# Patient Record
Sex: Male | Born: 1960 | Race: White | Hispanic: No | Smoking: Current some day smoker
Health system: Southern US, Community
[De-identification: ages and names within clinical notes are randomized; demographics above are authoritative.]

## PROBLEM LIST (undated history)

## (undated) DIAGNOSIS — Z9889 Other specified postprocedural states: Secondary | ICD-10-CM

## (undated) DIAGNOSIS — N200 Calculus of kidney: Secondary | ICD-10-CM

## (undated) DIAGNOSIS — R112 Nausea with vomiting, unspecified: Secondary | ICD-10-CM

## (undated) HISTORY — PX: CHOLECYSTECTOMY: SHX55

## (undated) HISTORY — PX: JOINT REPLACEMENT: SHX530

---

## 2008-06-16 ENCOUNTER — Ambulatory Visit: Payer: Self-pay | Admitting: Specialist

## 2008-07-19 ENCOUNTER — Ambulatory Visit: Payer: Self-pay | Admitting: Specialist

## 2011-03-27 ENCOUNTER — Ambulatory Visit: Payer: Self-pay | Admitting: Family Medicine

## 2011-04-01 ENCOUNTER — Ambulatory Visit: Payer: Self-pay | Admitting: Family Medicine

## 2011-04-23 ENCOUNTER — Ambulatory Visit: Payer: Self-pay | Admitting: Gastroenterology

## 2011-05-23 ENCOUNTER — Ambulatory Visit: Payer: Self-pay | Admitting: Surgery

## 2011-09-22 ENCOUNTER — Ambulatory Visit: Payer: Self-pay | Admitting: Urology

## 2011-10-10 ENCOUNTER — Ambulatory Visit: Payer: Self-pay | Admitting: Surgery

## 2012-06-04 ENCOUNTER — Ambulatory Visit: Payer: Self-pay | Admitting: Gastroenterology

## 2012-06-08 LAB — PATHOLOGY REPORT

## 2014-09-24 NOTE — Op Note (Signed)
PATIENT NAME:  Clancy GourdROBERTS, Kalim L MR#:  [id number] DATE OF BIRTH:  30-Jan-1961  DATE OF PROCEDURE:  05/23/2011  PREOPERATIVE DIAGNOSIS: Chronic acalculous cholecystitis.   POSTOPERATIVE DIAGNOSIS: Chronic acalculous cholecystitis.  PROCEDURE: Laparoscopic cholecystectomy.   SURGEON: Adella HareJ. Wilton Smith, MD   ASSISTANT: Carmell AustriaAnn Collins, PA   ANESTHESIA: General.   INDICATIONS: This 54 year old male has a history of right upper quadrant pain and ultrasound findings of multiple small polyps in the lining of his gallbladder. Surgery was recommended for definitive treatment.   DESCRIPTION OF PROCEDURE: The patient was placed on the operating table in the supine position under general endotracheal anesthesia. The abdomen was prepared with ChloraPrep and draped in a sterile manner.   A short incision was made in the inferior aspect of the umbilicus and carried down to the deep fascia which was grasped with laryngeal hook and elevated. A Veress needle was inserted, aspirated, and irrigated with a saline solution. Next, the peritoneal cavity was inflated with carbon dioxide. The Veress needle was removed. The 10 mm cannula was inserted. The 10 mm 0 degree laparoscope was inserted to view the peritoneal cavity. The liver appeared to have some minimal degree of fatty infiltration. Another incision was made in the epigastrium slightly to the right of the midline to introduce an 11 mm cannula. Two incisions were made in the lateral aspect of the right upper quadrant to introduce two 5-mm cannulas.   The gallbladder was retracted towards the right shoulder. The infundibulum was retracted inferiorly and laterally. The porta hepatis was demonstrated. Fatty tissue overlying the neck of the gallbladder was incised sharply with the scissors. Next, fatty tissue was dissected away exposing the cystic duct which was dissected free from surrounding structures. The cystic artery was dissected free from surrounding structures. The  neck of the gallbladder was further mobilized with incision of the visceral peritoneum. A critical view of safety was demonstrated. An Endoclip was placed across the cystic duct adjacent to the neck of the gallbladder. An incision was made in the cystic duct to introduce a Reddick catheter, however, the Reddick catheter would not thread and cystic duct caliber appeared to be small and, therefore, cholangiogram was not done. The Reddick catheter was removed. The cystic duct was doubly ligated with endoclips and divided. The cystic artery was controlled with double endoclips and divided. The gallbladder was dissected free from the liver with hook and cautery. There was only scant bleeding and hemostasis was subsequently intact. The gallbladder was delivered up through the infraumbilical incision, opened, suctioned, removed, and submitted in formalin for routine pathology. The right upper quadrant was further inspected. Hemostasis was intact. The cannulas were removed. Carbon dioxide was allowed to escape from the peritoneal cavity. Skin incisions were closed with interrupted 5-0 chromic subcuticular sutures, benzoin, and Steri-Strips. Dressings were applied with paper tape. The patient tolerated surgery satisfactorily and was then prepared for transfer to the recovery room.  ____________________________ Shela CommonsJ. Renda RollsWilton Smith, MD jws:drc D: 05/23/2011 10:05:31 ET T: 05/23/2011 11:43:41 ET JOB#: [id number]  cc: Adella HareJ. Wilton Smith, MD, <Dictator> Adella HareWILTON J SMITH MD ELECTRONICALLY SIGNED 06/15/2011 9:04

## 2015-04-23 ENCOUNTER — Emergency Department
Admission: EM | Admit: 2015-04-23 | Discharge: 2015-04-23 | Disposition: A | Discharge status: Home / Self Care | Payer: BLUE CROSS/BLUE SHIELD | Attending: Emergency Medicine | Admitting: Emergency Medicine

## 2015-04-23 ENCOUNTER — Emergency Department: Payer: BLUE CROSS/BLUE SHIELD

## 2015-04-23 ENCOUNTER — Encounter: Payer: Self-pay | Admitting: Medical Oncology

## 2015-04-23 DIAGNOSIS — N200 Calculus of kidney: Secondary | ICD-10-CM

## 2015-04-23 DIAGNOSIS — R109 Unspecified abdominal pain: Secondary | ICD-10-CM | POA: Diagnosis present

## 2015-04-23 DIAGNOSIS — Z880 Allergy status to penicillin: Secondary | ICD-10-CM | POA: Insufficient documentation

## 2015-04-23 HISTORY — DX: Calculus of kidney: N20.0

## 2015-04-23 LAB — BASIC METABOLIC PANEL
Anion gap: 9 (ref 5–15)
BUN: 12 mg/dL (ref 6–20)
CO2: 21 mmol/L — ABNORMAL LOW (ref 22–32)
Calcium: 8.6 mg/dL — ABNORMAL LOW (ref 8.9–10.3)
Chloride: 100 mmol/L — ABNORMAL LOW (ref 101–111)
Creatinine, Ser: 0.96 mg/dL (ref 0.61–1.24)
GFR calc Af Amer: >60 mL/min (ref >60)
Glucose, Bld: 159 mg/dL — ABNORMAL HIGH (ref 65–99)
POTASSIUM: 3.8 mmol/L (ref 3.5–5.1)
SODIUM: 130 mmol/L — AB (ref 135–145)

## 2015-04-23 LAB — URINALYSIS COMPLETE WITH MICROSCOPIC (ARMC ONLY)
BILIRUBIN URINE: NEGATIVE
Bacteria, UA: NONE SEEN
GLUCOSE, UA: NEGATIVE mg/dL
KETONES UR: NEGATIVE mg/dL
LEUKOCYTES UA: NEGATIVE
NITRITE: NEGATIVE
Protein, ur: 30 mg/dL — AB
SPECIFIC GRAVITY, URINE: 1.024 (ref 1.005–1.030)
pH: 6 (ref 5.0–8.0)

## 2015-04-23 LAB — CBC
HEMATOCRIT: 43.9 % (ref 40.0–52.0)
Hemoglobin: 15 g/dL (ref 13.0–18.0)
MCH: 31.7 pg (ref 26.0–34.0)
MCHC: 34.1 g/dL (ref 32.0–36.0)
MCV: 93 fL (ref 80.0–100.0)
PLATELETS: 276 10*3/uL (ref 150–440)
RBC: 4.72 MIL/uL (ref 4.40–5.90)
RDW: 12.7 % (ref 11.5–14.5)
WBC: 11.3 10*3/uL — AB (ref 3.8–10.6)

## 2015-04-23 MED ORDER — KETOROLAC TROMETHAMINE 30 MG/ML IJ SOLN
30.0000 mg | Freq: Once | INTRAMUSCULAR | Status: AC
  Administered 2015-04-23: 30 mg via INTRAVENOUS
  Filled 2015-04-23: qty 1

## 2015-04-23 MED ORDER — ONDANSETRON HCL 4 MG/2ML IJ SOLN
INTRAMUSCULAR | Status: AC
  Administered 2015-04-23: 4 mg via INTRAVENOUS
  Filled 2015-04-23: qty 2

## 2015-04-23 MED ORDER — TAMSULOSIN HCL 0.4 MG PO CAPS: 0.4000 mg | ORAL_CAPSULE | Freq: Every day | ORAL | Status: DC

## 2015-04-23 MED ORDER — SODIUM CHLORIDE 0.9 % IV BOLUS (SEPSIS)
1000.0000 mL | Freq: Once | INTRAVENOUS | Status: AC
  Administered 2015-04-23: 1000 mL via INTRAVENOUS

## 2015-04-23 MED ORDER — FENTANYL CITRATE (PF) 100 MCG/2ML IJ SOLN
50.0000 ug | Freq: Once | INTRAMUSCULAR | Status: AC
  Administered 2015-04-23: 50 ug via INTRAVENOUS

## 2015-04-23 MED ORDER — OXYCODONE-ACETAMINOPHEN 5-325 MG PO TABS: 1.0000 | ORAL_TABLET | Freq: Four times a day (QID) | ORAL | Status: DC | PRN

## 2015-04-23 MED ORDER — FENTANYL CITRATE (PF) 100 MCG/2ML IJ SOLN
INTRAMUSCULAR | Status: AC
  Administered 2015-04-23: 50 ug via INTRAVENOUS
  Filled 2015-04-23: qty 2

## 2015-04-23 MED ORDER — FENTANYL CITRATE (PF) 100 MCG/2ML IJ SOLN
50.0000 ug | Freq: Once | INTRAMUSCULAR | Status: AC
  Administered 2015-04-23: 50 ug via INTRAVENOUS
  Filled 2015-04-23: qty 2

## 2015-04-23 MED ORDER — TAMSULOSIN HCL 0.4 MG PO CAPS
0.4000 mg | ORAL_CAPSULE | Freq: Once | ORAL | Status: AC
  Administered 2015-04-23: 0.4 mg via ORAL
  Filled 2015-04-23: qty 1

## 2015-04-23 MED ORDER — ONDANSETRON HCL 4 MG/2ML IJ SOLN
4.0000 mg | Freq: Once | INTRAMUSCULAR | Status: AC
  Administered 2015-04-23: 4 mg via INTRAVENOUS

## 2015-04-23 NOTE — ED Provider Notes (Addendum)
[facility] Emergency Department Provider Note  ____________________________________________  Time seen: Approximately 5 PM  I have reviewed the triage vital signs and the nursing notes.   HISTORY  Chief Complaint Flank Pain    HPI Corey Livingston is a 54 y.o. male with a history of kidney stones who is presenting today with left flank pain over the past 3 days. He said that he had sudden onset of left flank pain this past Saturday. He says that it feels like his past kidney stones. [facility]. He has never needed intervention to pass his stones. He does not take Flomax on a regular basis. He says that his urine has also been darker. He describes the pain as moderate to severe and intermittent. It radiates from his left back to his abdomen and down into his pelvis. He said the pain suddenly got worse today and he had episodes of nausea and vomiting.   Past Medical History  Diagnosis Date  . Kidney stone     There are no active problems to display for this patient.   Past Surgical History  Procedure Laterality Date  . Cholecystectomy    . Joint replacement      No current outpatient prescriptions on file.  Allergies Penicillins  No family history on file.  Social History Social History  Substance Use Topics  . Smoking status: Never Smoker   . Smokeless tobacco: None  . Alcohol Use: Yes    Review of Systems Constitutional: No fever/chills Eyes: No visual changes. ENT: No sore throat. Cardiovascular: Denies chest pain. Respiratory: Denies shortness of breath. Gastrointestinal: No diarrhea.  No constipation. Genitourinary: Negative for dysuria. Musculoskeletal: Left flank pain.  Skin: Negative for rash. Neurological: Negative for headaches, focal weakness or numbness.  10-point ROS otherwise negative.  ____________________________________________   PHYSICAL EXAM:  VITAL SIGNS: ED Triage Vitals  Enc Vitals  Group     BP 04/23/15 1558 177/93 mmHg     Pulse Rate 04/23/15 1558 75     Resp 04/23/15 1558 18     Temp 04/23/15 1558 97.3 F (36.3 C)     Temp Source 04/23/15 1558 Oral     SpO2 04/23/15 1558 99 %     Weight 04/23/15 1558 211 lb (95.709 kg)     Height 04/23/15 1558 5\' 8"  (1.727 m)     Head Cir --      Peak Flow --      Pain Score 04/23/15 1558 10     Pain Loc --      Pain Edu? --      Excl. in GC? --     Constitutional: Alert and oriented. Well appearing and in no acute distress. Eyes: Conjunctivae are normal. PERRL. EOMI. Head: Atraumatic. Nose: No congestion/rhinnorhea. Mouth/Throat: Mucous membranes are moist.  Oropharynx non-erythematous. Neck: No stridor.   Cardiovascular: Normal rate, regular rhythm. Grossly normal heart sounds.  Good peripheral circulation. Respiratory: Normal respiratory effort.  No retractions. Lungs CTAB. Gastrointestinal: Soft with mild tenderness to the left upper and left lower quadrant. There is no rebound or guarding.. No distention. No abdominal bruits. Left CVA tenderness. Musculoskeletal: No lower extremity tenderness nor edema.  No joint effusions. Neurologic:  Normal speech and language. No gross focal neurologic deficits are appreciated. No gait instability. Skin:  Skin is warm, dry and intact. No rash noted. Psychiatric: Mood and affect are normal. Speech and behavior are normal.  ____________________________________________   LABS (all labs ordered  are listed, but only abnormal results are displayed)  Labs Reviewed  BASIC METABOLIC PANEL - Abnormal; Notable for the following:    Sodium 130 (*)    Chloride 100 (*)    CO2 21 (*)    Glucose, Bld 159 (*)    Calcium 8.6 (*)    All other components within normal limits  CBC - Abnormal; Notable for the following:    WBC 11.3 (*)    All other components within normal limits  URINALYSIS COMPLETEWITH MICROSCOPIC ([facility] ONLY) - Abnormal; Notable for the following:    Color, Urine YELLOW  (*)    APPearance CLEAR (*)    Hgb urine dipstick 3+ (*)    Protein, ur 30 (*)    Squamous Epithelial / LPF 0-5 (*)    All other components within normal limits   ____________________________________________  EKG   ____________________________________________  RADIOLOGY  No evidence of hydronephrosis. Small 4 mm stone in the right kidney and 9 mm stone in the left. ____________________________________________   PROCEDURES  ____________________________________________   INITIAL IMPRESSION / ASSESSMENT AND PLAN / ED COURSE  Pertinent labs & imaging results that were available during my care of the patient were reviewed by me and considered in my medical decision making (see chart for details).  ----------------------------------------- 6:41 PM on 04/23/2015 -----------------------------------------  Discussed the case with Dr. Annabell Howells of urology who says that as long as the patient has his pain controlled and he may follow-up in the office. Evaluate the patient at this time and still having pain. Says that the pain was transiently relieved with the fentanyl but now the pain is returning. We'll give dose of Toradol.  ----------------------------------------- 7:12 PM on 04/23/2015 -----------------------------------------  Patient is now resting comfortably after Toradol. No further nausea and vomiting. Discussed with the patient his lab, urine as well as imaging findings. More than likely this is a ureteral stone causing the symptoms. The symptoms are classic and the patient also has blood in his urine. I'll be giving him Percocet, Flomax as well as a urine strainer to go home with. He knows to follow with prolonged neurologic. He also knows that he must return immediately to the emergency department if his pain worsens, his vomiting and nausea return or he develops a fever. He also knows that he should return for any worsening or concerning symptoms. Patient understands the plan  and is one to comply. ____________________________________________   FINAL CLINICAL IMPRESSION(S) / ED DIAGNOSES  Flank pain. Kidney stones.    Myrna Blazer, MD 04/23/15 1913  Blood pressure is elevated but I suspect this is likely due to the patient's pain. The patient says that he also gets white coat hypertension when he goes to see his primary care doctor.  Myrna Blazer, MD 04/23/15 [id number]

## 2015-04-23 NOTE — Discharge Instructions (Signed)
Flank Pain °Flank pain refers to pain that is located on the side of the body between the upper abdomen and the back. The pain may occur over a short period of time (acute) or may be long-term or reoccurring (chronic). It may be mild or severe. Flank pain can be caused by many things. °CAUSES  °Some of the more common causes of flank pain include: °· Muscle strains.   °· Muscle spasms.   °· A disease of your spine (vertebral disk disease).   °· A lung infection (pneumonia).   °· Fluid around your lungs (pulmonary edema).   °· A kidney infection.   °· Kidney stones.   °· A very painful skin rash caused by the chickenpox virus (shingles).   °· Gallbladder disease.   °HOME CARE INSTRUCTIONS  °Home care will depend on the cause of your pain. In general, °· Rest as directed by your caregiver. °· Drink enough fluids to keep your urine clear or pale yellow. °· Only take over-the-counter or prescription medicines as directed by your caregiver. Some medicines may help relieve the pain. °· Tell your caregiver about any changes in your pain. °· Follow up with your caregiver as directed. °SEEK IMMEDIATE MEDICAL CARE IF:  °· Your pain is not controlled with medicine.   °· You have new or worsening symptoms. °· Your pain increases.   °· You have abdominal pain.   °· You have shortness of breath.   °· You have persistent nausea or vomiting.   °· You have swelling in your abdomen.   °· You feel faint or pass out.   °· You have blood in your urine. °· You have a fever or persistent symptoms for more than 2-3 days. °· You have a fever and your symptoms suddenly get worse. °MAKE SURE YOU:  °· Understand these instructions. °· Will watch your condition. °· Will get help right away if you are not doing well or get worse. °  °This information is not intended to replace advice given to you by your health care provider. Make sure you discuss any questions you have with your health care provider. °  °Document Released: 07/10/2005 Document  Revised: 02/11/2012 Document Reviewed: 01/01/2012 °Elsevier Interactive Patient Education ©2016 [employer] ° °Kidney Stones °Kidney stones (urolithiasis) are deposits that form inside your kidneys. The intense pain is caused by the stone moving through the urinary tract. When the stone moves, the ureter goes into spasm around the stone. The stone is usually passed in the urine.  °CAUSES  °· A disorder that makes certain neck glands produce too much parathyroid hormone (primary hyperparathyroidism). °· A buildup of uric acid crystals, similar to gout in your joints. °· Narrowing (stricture) of the ureter. °· A kidney obstruction present at birth (congenital obstruction). °· Previous surgery on the kidney or ureters. °· Numerous kidney infections. °SYMPTOMS  °· Feeling sick to your stomach (nauseous). °· Throwing up (vomiting). °· Blood in the urine (hematuria). °· Pain that usually spreads (radiates) to the groin. °· Frequency or urgency of urination. °DIAGNOSIS  °· Taking a history and physical exam. °· Blood or urine tests. °· CT scan. °· Occasionally, an examination of the inside of the urinary bladder (cystoscopy) is performed. °TREATMENT  °· Observation. °· Increasing your fluid intake. °· Extracorporeal shock wave lithotripsy--This is a noninvasive procedure that uses shock waves to break up kidney stones. °· Surgery may be needed if you have severe pain or persistent obstruction. There are various surgical procedures. Most of the procedures are performed with the use of small instruments. Only small incisions are   needed to accommodate these instruments, so recovery time is minimized. °The size, location, and chemical composition are all important variables that will determine the proper choice of action for you. Talk to your health care provider to better understand your situation so that you will minimize the risk of injury to yourself and your kidney.  °HOME CARE INSTRUCTIONS  °· Drink enough water and  fluids to keep your urine clear or pale yellow. This will help you to pass the stone or stone fragments. °· Strain all urine through the provided strainer. Keep all particulate matter and stones for your health care provider to see. The stone causing the pain may be as small as a grain of salt. It is very important to use the strainer each and every time you pass your urine. The collection of your stone will allow your health care provider to analyze it and verify that a stone has actually passed. The stone analysis will often identify what you can do to reduce the incidence of recurrences. °· Only take over-the-counter or prescription medicines for pain, discomfort, or fever as directed by your health care provider. °· Keep all follow-up visits as told by your health care provider. This is important. °· Get follow-up X-rays if required. The absence of pain does not always mean that the stone has passed. It may have only stopped moving. If the urine remains completely obstructed, it can cause loss of kidney function or even complete destruction of the kidney. It is your responsibility to make sure X-rays and follow-ups are completed. Ultrasounds of the kidney can show blockages and the status of the kidney. Ultrasounds are not associated with any radiation and can be performed easily in a matter of minutes. °· Make changes to your daily diet as told by your health care provider. You may be told to: °¨ Limit the amount of salt that you eat. °¨ Eat 5 or more servings of fruits and vegetables each day. °¨ Limit the amount of meat, poultry, fish, and eggs that you eat. °· Collect a 24-hour urine sample as told by your health care provider. You may need to collect another urine sample every 6-12 months. °SEEK MEDICAL CARE IF: °· You experience pain that is progressive and unresponsive to any pain medicine you have been prescribed. °SEEK IMMEDIATE MEDICAL CARE IF:  °· Pain cannot be controlled with the prescribed  medicine. °· You have a fever or shaking chills. °· The severity or intensity of pain increases over 18 hours and is not relieved by pain medicine. °· You develop a new onset of abdominal pain. °· You feel faint or pass out. °· You are unable to urinate. °  °This information is not intended to replace advice given to you by your health care provider. Make sure you discuss any questions you have with your health care provider. °  °Document Released: 05/19/2005 Document Revised: 02/07/2015 Document Reviewed: 10/20/2012 °Elsevier Interactive Patient Education ©2016 [employer] Inc[employer] ° °

## 2015-04-23 NOTE — ED Notes (Signed)
Pt reports that he began having left sided flank pain Saturday that has continued to worsen. Pt reports no difficulty urinating however pain so severe pt began having n/v.

## 2015-04-23 NOTE — ED Notes (Signed)
Patient transported to Ultrasound 

## 2015-12-17 ENCOUNTER — Encounter: Payer: Self-pay | Admitting: Podiatry

## 2015-12-17 ENCOUNTER — Ambulatory Visit (INDEPENDENT_AMBULATORY_CARE_PROVIDER_SITE_OTHER): Payer: BLUE CROSS/BLUE SHIELD

## 2015-12-17 ENCOUNTER — Ambulatory Visit (INDEPENDENT_AMBULATORY_CARE_PROVIDER_SITE_OTHER): Payer: BLUE CROSS/BLUE SHIELD | Admitting: Podiatry

## 2015-12-17 DIAGNOSIS — M79672 Pain in left foot: Secondary | ICD-10-CM | POA: Diagnosis not present

## 2015-12-17 DIAGNOSIS — M7672 Peroneal tendinitis, left leg: Secondary | ICD-10-CM

## 2015-12-17 DIAGNOSIS — M722 Plantar fascial fibromatosis: Secondary | ICD-10-CM

## 2015-12-17 MED ORDER — MELOXICAM 15 MG PO TABS: 15.0000 mg | ORAL_TABLET | Freq: Every day | ORAL | Status: DC

## 2015-12-17 MED ORDER — METHYLPREDNISOLONE 4 MG PO TBPK: ORAL_TABLET | ORAL | Status: DC

## 2015-12-17 NOTE — Patient Instructions (Signed)

## 2015-12-17 NOTE — Progress Notes (Signed)
   Subjective:    Patient ID: Corey Livingston, male    DOB: 1961-03-28, 55 y.o.   MRN: [id number]  HPI: He presents today with as a new patient with a chief complaint of a 6-8 week duration of pain to the plantar and the lateral aspect of his left foot. He states that is very painful on palpation and has had a history of plantar fasciitis in the past.    Review of Systems  All other systems reviewed and are negative.      Objective:   Physical Exam: Vital signs are stable with alert and oriented 3 pulses are palpable. Neurologic sensorium is intact percent lasting monofilament. The tendon reflexes are intact. Muscle strength is normal bilateral. Orthopedic evaluation demonstrates tenderness on palpation fifth metatarsal base of the left foot with no tenderness on palpation of the peroneal tendons. He does have pain on palpation medial tubercle of the left heel radiographs taken today 3 views of the left foot demonstrates an os perineum which is present but is nontender. Otherwise he has soft tissue increase in density of the plantar fascia calcaneal insertion site of the left heel. This is consistent with plantar fasciitis.        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  Plan: I injected the left till today with Kenalog and local anesthetic placement of a fracture brace and the night splint. Start him on a Medrol Dosepak to be followed by meloxicam. Discussed appropriate shoe gear stretching exercises and ice therapy. Follow up with him in 1 month.

## 2016-01-14 ENCOUNTER — Ambulatory Visit (INDEPENDENT_AMBULATORY_CARE_PROVIDER_SITE_OTHER): Payer: BLUE CROSS/BLUE SHIELD | Admitting: Podiatry

## 2016-01-14 ENCOUNTER — Ambulatory Visit: Payer: BLUE CROSS/BLUE SHIELD | Admitting: Podiatry

## 2016-01-14 ENCOUNTER — Encounter: Payer: Self-pay | Admitting: Podiatry

## 2016-01-14 DIAGNOSIS — M722 Plantar fascial fibromatosis: Secondary | ICD-10-CM

## 2016-01-14 MED ORDER — MELOXICAM 15 MG PO TABS: 15.0000 mg | ORAL_TABLET | Freq: Every day | ORAL | 3 refills | Status: DC

## 2016-01-14 NOTE — Progress Notes (Signed)
He presents for follow-up of his lateral compensatory syndrome secondary to plantar fasciitis of the left foot. He states that he is greater than 90% improved. He never took his Medrol or his meloxicam. He did not realize that it was at the pharmacy. He states that his only remaining pain is located in the head of the fifth metatarsal as he points to the region.  Objective: Vital signs are stable. Alert and oriented 3 pulses are palpable. No reproducible pain on palpation of the left heel no reproducible pain on lateral aspect of the foot otherwise the only pain reproducible is a fifth metatarsal head.  Assessment: Well-healing plantar fasciitis and lateral compensatory syndrome.  Plan: I encouraged him to start on his meloxicam we will forego the Medrol at this point since he is somewhat improved. I encouraged him to continue all of his same activities for the next month or so until he is 100% improved plus one month. I will follow-up with him as needed.

## 2020-06-20 ENCOUNTER — Other Ambulatory Visit: Payer: BLUE CROSS/BLUE SHIELD

## 2020-06-20 ENCOUNTER — Other Ambulatory Visit: Payer: Self-pay

## 2020-06-20 DIAGNOSIS — Z20822 Contact with and (suspected) exposure to covid-19: Secondary | ICD-10-CM

## 2020-06-22 LAB — SARS-COV-2, NAA 2 DAY TAT

## 2020-06-22 LAB — NOVEL CORONAVIRUS, NAA: SARS-CoV-2, NAA: NOT DETECTED

## 2020-08-14 ENCOUNTER — Other Ambulatory Visit: Payer: Self-pay

## 2020-08-14 ENCOUNTER — Emergency Department: Payer: Commercial Managed Care - PPO

## 2020-08-14 DIAGNOSIS — Z9660 Presence of unspecified orthopedic joint implant: Secondary | ICD-10-CM | POA: Diagnosis not present

## 2020-08-14 DIAGNOSIS — N202 Calculus of kidney with calculus of ureter: Secondary | ICD-10-CM | POA: Diagnosis not present

## 2020-08-14 DIAGNOSIS — R109 Unspecified abdominal pain: Secondary | ICD-10-CM | POA: Diagnosis present

## 2020-08-14 LAB — URINALYSIS, ROUTINE W REFLEX MICROSCOPIC
Bacteria, UA: NONE SEEN
Bilirubin Urine: NEGATIVE
Glucose, UA: NEGATIVE mg/dL
Ketones, ur: 5 mg/dL — AB
Nitrite: NEGATIVE
Protein, ur: 100 mg/dL — AB
Specific Gravity, Urine: 1.029 (ref 1.005–1.030)
pH: 5 (ref 5.0–8.0)

## 2020-08-14 MED ORDER — KETOROLAC TROMETHAMINE 30 MG/ML IJ SOLN
15.0000 mg | Freq: Once | INTRAMUSCULAR | Status: AC
  Administered 2020-08-14: 15 mg via INTRAMUSCULAR

## 2020-08-14 NOTE — ED Triage Notes (Signed)
Pt states he started having left side flank pain that started this evening. Pt denies difficulty urinating, denies frequency or burning. Pt states last month he passed a clot while peeing.

## 2020-08-15 ENCOUNTER — Emergency Department
Admission: EM | Admit: 2020-08-15 | Discharge: 2020-08-15 | Disposition: A | Discharge status: Home / Self Care | Payer: Commercial Managed Care - PPO | Attending: Emergency Medicine | Admitting: Emergency Medicine

## 2020-08-15 DIAGNOSIS — N200 Calculus of kidney: Secondary | ICD-10-CM

## 2020-08-15 LAB — BASIC METABOLIC PANEL
Anion gap: 8 (ref 5–15)
BUN: 12 mg/dL (ref 6–20)
CO2: 24 mmol/L (ref 22–32)
Calcium: 9.7 mg/dL (ref 8.9–10.3)
Chloride: 105 mmol/L (ref 98–111)
Creatinine, Ser: 0.87 mg/dL (ref 0.61–1.24)
GFR, Estimated: >60 mL/min (ref >60)
Glucose, Bld: 180 mg/dL — ABNORMAL HIGH (ref 70–99)
Potassium: 4.2 mmol/L (ref 3.5–5.1)
Sodium: 137 mmol/L (ref 135–145)

## 2020-08-15 LAB — CBC WITH DIFFERENTIAL/PLATELET
Abs Immature Granulocytes: 0.08 10*3/uL — ABNORMAL HIGH (ref 0.00–0.07)
Basophils Absolute: 0.1 10*3/uL (ref 0.0–0.1)
Basophils Relative: 1 %
Eosinophils Absolute: 0.1 10*3/uL (ref 0.0–0.5)
Eosinophils Relative: 1 %
HCT: 49.5 % (ref 39.0–52.0)
Hemoglobin: 17.1 g/dL — ABNORMAL HIGH (ref 13.0–17.0)
Immature Granulocytes: 1 %
Lymphocytes Relative: 12 %
Lymphs Abs: 1.2 10*3/uL (ref 0.7–4.0)
MCH: 33.5 pg (ref 26.0–34.0)
MCHC: 34.5 g/dL (ref 30.0–36.0)
MCV: 97.1 fL (ref 80.0–100.0)
Monocytes Absolute: 0.4 10*3/uL (ref 0.1–1.0)
Monocytes Relative: 4 %
Neutro Abs: 8.4 10*3/uL — ABNORMAL HIGH (ref 1.7–7.7)
Neutrophils Relative %: 81 %
Platelets: 206 10*3/uL (ref 150–400)
RBC: 5.1 MIL/uL (ref 4.22–5.81)
RDW: 12.7 % (ref 11.5–15.5)
WBC: 10.3 10*3/uL (ref 4.0–10.5)
nRBC: 0 % (ref 0.0–0.2)

## 2020-08-15 MED ORDER — ONDANSETRON 4 MG PO TBDP: 4.0000 mg | ORAL_TABLET | Freq: Four times a day (QID) | ORAL | 0 refills | Status: DC | PRN

## 2020-08-15 MED ORDER — KETOROLAC TROMETHAMINE 30 MG/ML IJ SOLN
15.0000 mg | Freq: Once | INTRAMUSCULAR | Status: AC
  Administered 2020-08-15: 15 mg via INTRAMUSCULAR
  Filled 2020-08-15: qty 1

## 2020-08-15 MED ORDER — OXYCODONE-ACETAMINOPHEN 5-325 MG PO TABS: 2.0000 | ORAL_TABLET | Freq: Four times a day (QID) | ORAL | 0 refills | Status: DC | PRN

## 2020-08-15 MED ORDER — TAMSULOSIN HCL 0.4 MG PO CAPS: 0.4000 mg | ORAL_CAPSULE | Freq: Every day | ORAL | 0 refills | Status: DC

## 2020-08-15 MED ORDER — IBUPROFEN 800 MG PO TABS: 800.0000 mg | ORAL_TABLET | Freq: Three times a day (TID) | ORAL | 0 refills | Status: DC | PRN

## 2020-08-15 MED ORDER — OXYCODONE-ACETAMINOPHEN 5-325 MG PO TABS
2.0000 | ORAL_TABLET | Freq: Once | ORAL | Status: AC
  Administered 2020-08-15: 2 via ORAL
  Filled 2020-08-15: qty 2

## 2020-08-15 MED ORDER — ONDANSETRON 4 MG PO TBDP
4.0000 mg | ORAL_TABLET | Freq: Once | ORAL | Status: AC
  Administered 2020-08-15: 4 mg via ORAL
  Filled 2020-08-15: qty 1

## 2020-08-15 NOTE — ED Notes (Signed)
Pt reports right left flank pain that began tonight. Pt reports that he initially thought it may be gas pains but did not a lot of gas released. Pt denies any current urinary burning, urgency, or frequency.

## 2020-08-15 NOTE — Discharge Instructions (Signed)

## 2020-08-15 NOTE — ED Provider Notes (Signed)
[facility] Emergency Department Provider Note  ____________________________________________   Event Date/Time   First MD Initiated Contact with Patient 08/15/20 [phone]     (approximate)  I have reviewed the triage vital signs and the nursing notes.   HISTORY  Chief Complaint Flank Pain    HPI Corey Livingston is a 60 y.o. male history of previous kidney stones who presents to the emergency department with left flank pain that started tonight.  Complains of nausea but no vomiting.  No fever.  No dysuria, hematuria.  States this felt slightly different than his previous kidney stones.  Was given IM Toradol in the waiting room and reports his pain has almost completely resolved.  Was previously seen by [facility] urology but has not seen them in years.        Past Medical History:  Diagnosis Date  . Kidney stone     There are no problems to display for this patient.   Past Surgical History:  Procedure Laterality Date  . CHOLECYSTECTOMY    . JOINT REPLACEMENT      Prior to Admission medications   Medication Sig Start Date End Date Taking? Authorizing Provider  ibuprofen (ADVIL) 800 MG tablet Take 1 tablet (800 mg total) by mouth every 8 (eight) hours as needed for mild pain. 08/15/20  Yes Ashayla Subia N, DO  ondansetron (ZOFRAN ODT) 4 MG disintegrating tablet Take 1 tablet (4 mg total) by mouth every 6 (six) hours as needed for nausea or vomiting. 08/15/20  Yes Jeree Delcid, Layla Maw, DO  oxyCODONE-acetaminophen (PERCOCET) 5-325 MG tablet Take 2 tablets by mouth every 6 (six) hours as needed for severe pain. 08/15/20 08/15/21 Yes Ismelda Weatherman, Layla Maw, DO  tamsulosin (FLOMAX) 0.4 MG CAPS capsule Take 1 capsule (0.4 mg total) by mouth daily. 08/15/20  Yes Khadeem Rockett, Layla Maw, DO  meloxicam (MOBIC) 15 MG tablet Take 1 tablet (15 mg total) by mouth daily. 01/14/16   Hyatt, Max T, DPM    Allergies Penicillins  No family history on file.  Social History Social History    Tobacco Use  . Smoking status: Never Smoker  . Smokeless tobacco: Never Used  Substance Use Topics  . Alcohol use: Yes    Review of Systems Constitutional: No fever. Eyes: No visual changes. ENT: No sore throat. Cardiovascular: Denies chest pain. Respiratory: Denies shortness of breath. Gastrointestinal: No vomiting, diarrhea. Genitourinary: Negative for dysuria. Musculoskeletal: Negative for back pain. Skin: Negative for rash. Neurological: Negative for focal weakness or numbness.  ____________________________________________   PHYSICAL EXAM:  VITAL SIGNS: ED Triage Vitals  Enc Vitals Group     BP 08/14/20 2324 136/79     Pulse Rate 08/14/20 2324 72     Resp 08/14/20 2324 16     Temp 08/14/20 2324 98 F (36.7 C)     Temp src --      SpO2 08/14/20 2324 97 %     Weight 08/14/20 2322 245 lb (111.1 kg)     Height 08/14/20 2322 5\' 7"  (1.702 m)     Head Circumference --      Peak Flow --      Pain Score 08/14/20 2322 8     Pain Loc --      Pain Edu? --      Excl. in GC? --    CONSTITUTIONAL: Alert and oriented and responds appropriately to questions. Well-appearing; well-nourished HEAD: Normocephalic EYES: Conjunctivae clear, pupils appear equal, EOM appear intact ENT: normal nose; moist  mucous membranes NECK: Supple, normal ROM CARD: RRR; S1 and S2 appreciated; no murmurs, no clicks, no rubs, no gallops RESP: Normal chest excursion without splinting or tachypnea; breath sounds clear and equal bilaterally; no wheezes, no rhonchi, no rales, no hypoxia or respiratory distress, speaking full sentences ABD/GI: Normal bowel sounds; non-distended; soft, non-tender, no rebound, no guarding, no peritoneal signs, no hepatosplenomegaly, obese BACK: The back appears normal EXT: Normal ROM in all joints; no deformity noted, no edema; no cyanosis SKIN: Normal color for age and race; warm; no rash on exposed skin NEURO: Moves all extremities equally PSYCH: The patient's mood  and manner are appropriate.  ____________________________________________   LABS (all labs ordered are listed, but only abnormal results are displayed)  Labs Reviewed  URINALYSIS, ROUTINE W REFLEX MICROSCOPIC - Abnormal; Notable for the following components:      Result Value   Color, Urine AMBER (*)    APPearance HAZY (*)    Hgb urine dipstick MODERATE (*)    Ketones, ur 5 (*)    Protein, ur 100 (*)    Leukocytes,Ua TRACE (*)    All other components within normal limits  CBC WITH DIFFERENTIAL/PLATELET - Abnormal; Notable for the following components:   Hemoglobin 17.1 (*)    Neutro Abs 8.4 (*)    Abs Immature Granulocytes 0.08 (*)    All other components within normal limits  BASIC METABOLIC PANEL - Abnormal; Notable for the following components:   Glucose, Bld 180 (*)    All other components within normal limits   ____________________________________________  EKG None ____________________________________________  RADIOLOGY I, Danasia Baker, personally viewed and evaluated these images (plain radiographs) as part of my medical decision making, as well as reviewing the written report by the radiologist.  ED MD interpretation: Left sided proximal ureteral stone.  Official radiology report(s): CT Renal Stone Study  Result Date: 08/14/2020 CLINICAL DATA:  Left flank pain. EXAM: CT ABDOMEN AND PELVIS WITHOUT CONTRAST TECHNIQUE: Multidetector CT imaging of the abdomen and pelvis was performed following the standard protocol without IV contrast. COMPARISON:  Oct 10, 2011 FINDINGS: Lower chest: No acute abnormality. Hepatobiliary: No focal liver abnormality is seen. Status post cholecystectomy. No biliary dilatation. Pancreas: Unremarkable. No pancreatic ductal dilatation or surrounding inflammatory changes. Spleen: Normal in size without focal abnormality. Adrenals/Urinary Tract: Adrenal glands are unremarkable. Kidneys are normal, without focal lesions. A 6 mm obstructing renal  stone is seen within the proximal left ureter, with mild left-sided hydronephrosis and hydroureter. The urinary bladder is contracted and subsequently limited in evaluation. Stomach/Bowel: Stomach is within normal limits. The appendix is not identified. No evidence of bowel wall thickening, distention, or inflammatory changes. Noninflamed diverticula are seen throughout the sigmoid colon. Vascular/Lymphatic: Aortic atherosclerosis. No enlarged abdominal or pelvic lymph nodes. Reproductive: Prostate is unremarkable. Other: No abdominal wall hernia or abnormality. No abdominopelvic ascites. Musculoskeletal: No acute or significant osseous findings. IMPRESSION: 1.   6 mm obstructing renal stone within the proximal left ureter. 2.   Sigmoid diverticulosis. 3.   Evidence of prior cholecystectomy. 4. Aortic atherosclerosis. Aortic Atherosclerosis (ICD10-I70.0). Electronically Signed   By: Aram Candela M.D.   On: 08/14/2020 23:56    ____________________________________________   PROCEDURES  Procedure(s) performed (including Critical Care):  Procedures  ____________________________________________   INITIAL IMPRESSION / ASSESSMENT AND PLAN / ED COURSE  As part of my medical decision making, I reviewed the following data within the electronic MEDICAL RECORD NUMBER History obtained from family, Nursing notes reviewed and incorporated, Labs reviewed ,  Old chart reviewed, Notes from prior ED visits and [place] Controlled Substance Database         Patient here with left-sided flank pain.  Imaging shows a 6 mm proximal ureteral stone.  Urine shows red blood cells and white blood cells but no bacteria.  He is afebrile.  He has not had any blood work since 2016 in our system.  We will check basic labs today to ensure no significant leukocytosis and normal creatinine.  He reports his pain is well controlled at this time and declines any further pain medicine.  Anticipate if he continues to do well that he will be  discharged home with pain medication and outpatient urology follow-up.  ED PROGRESS  Patient's labs are reassuring.  No leukocytosis.  Normal creatinine.  Ports pain is increasing a little bit.  Will give another dose of IM Toradol as well as Percocet, Zofran.  Will discharge home with outpatient follow-up and prescriptions.  Patient comfortable with plan.   At this time, I do not feel there is any life-threatening condition present. I have reviewed, interpreted and discussed all results (EKG, imaging, lab, urine as appropriate) and exam findings with patient/family. I have reviewed nursing notes and appropriate previous records.  I feel the patient is safe to be discharged home without further emergent workup and can continue workup as an outpatient as needed. Discussed usual and customary return precautions. Patient/family verbalize understanding and are comfortable with this plan.  Outpatient follow-up has been provided as needed. All questions have been answered.  ____________________________________________   FINAL CLINICAL IMPRESSION(S) / ED DIAGNOSES  Final diagnoses:  Kidney stone     ED Discharge Orders         Ordered    oxyCODONE-acetaminophen (PERCOCET) 5-325 MG tablet  Every 6 hours PRN        08/15/20 0131    ibuprofen (ADVIL) 800 MG tablet  Every 8 hours PRN        08/15/20 0131    ondansetron (ZOFRAN ODT) 4 MG disintegrating tablet  Every 6 hours PRN        08/15/20 0131    tamsulosin (FLOMAX) 0.4 MG CAPS capsule  Daily        08/15/20 0131          *Please note:  Corey Livingston was evaluated in Emergency Department on 08/15/2020 for the symptoms described in the history of present illness. He was evaluated in the context of the global COVID-19 pandemic, which necessitated consideration that the patient might be at risk for infection with the SARS-CoV-2 virus that causes COVID-19. Institutional protocols and algorithms that pertain to the evaluation of patients at  risk for COVID-19 are in a state of rapid change based on information released by regulatory bodies including the CDC and federal and state organizations. These policies and algorithms were followed during the patient's care in the ED.  Some ED evaluations and interventions may be delayed as a result of limited staffing during and the pandemic.*   Note:  This document was prepared using Dragon voice recognition software and may include unintentional dictation errors.   Passion Lavin, Layla Maw, DO 08/15/20 [id number]

## 2020-08-18 NOTE — H&P (View-Only) (Signed)
08/20/2020 11:13 AM   Raphael Gibney 01/16/1961 [id number]  Referring provider: Maryland Pink, MD [address] [facility] [place],  [place] [address] Chief Complaint  Patient presents with  . Nephrolithiasis    HPI: Corey Livingston is a 60 y.o. male who is seen today for ED follow-up of a recent episode of renal colic  - Patient reported to the ED on 08/15/2020 left flank pain.  - He noted nausea but no vomiting. - No dysuria or hematuria.  - He reported a history of kidney stones.  - He was given Flomax, percocet for pain prn, and zofran.  - CT renal stone study noted a 6 mm obstructing renal stone within the proximal left ureter with mild left hydronephrosis/hydroureter -Since ED visit he has had one episode of recurrent flank pain -Previous history of passing a 10 mm calculus   PMH: Past Medical History:  Diagnosis Date  . Kidney stone     Surgical History: Past Surgical History:  Procedure Laterality Date  . CHOLECYSTECTOMY    . JOINT REPLACEMENT      Home Medications:  Allergies as of 08/20/2020      Reactions   Penicillins       Medication List       Accurate as of August 20, 2020 11:13 AM. If you have any questions, ask your nurse or doctor.        ibuprofen 800 MG tablet Commonly known as: ADVIL Take 1 tablet (800 mg total) by mouth every 8 (eight) hours as needed for mild pain.   meloxicam 15 MG tablet Commonly known as: MOBIC Take 1 tablet (15 mg total) by mouth daily.   ondansetron 4 MG disintegrating tablet Commonly known as: Zofran ODT Take 1 tablet (4 mg total) by mouth every 6 (six) hours as needed for nausea or vomiting.   oxyCODONE-acetaminophen 5-325 MG tablet Commonly known as: Percocet Take 2 tablets by mouth every 6 (six) hours as needed for severe pain.   tamsulosin 0.4 MG Caps capsule Commonly known as: Flomax Take 1 capsule (0.4 mg total) by mouth daily.       Allergies:  Allergies  Allergen Reactions  .  Penicillins     Family History: History reviewed. No pertinent family history.  Social History:  reports that he has never smoked. He has never used smokeless tobacco. He reports current alcohol use. No history on file for drug use.   Physical Exam: BP (!) 178/95   Pulse 99   Ht 5' 10" (1.778 m)   Wt 245 lb (111.1 kg)   BMI 35.15 kg/m   Constitutional:  Alert and oriented, No acute distress. HEENT: [place] AT, moist mucus membranes.  Trachea midline, no masses. Cardiovascular: No clubbing, cyanosis, or edema. Respiratory: Normal respiratory effort, no increased work of breathing. Skin: No rashes, bruises or suspicious lesions. Neurologic: Grossly intact, no focal deficits, moving all 4 extremities. Psychiatric: Normal mood and affect.  Laboratory Data:  Lab Results  Component Value Date   CREATININE 0.87 08/15/2020    Urinalysis: Pending  Pertinent Imaging: CT images were personally reviewed and interpreted  CT Renal Stone Study  Narrative CLINICAL DATA:  Left flank pain.  EXAM: CT ABDOMEN AND PELVIS WITHOUT CONTRAST  TECHNIQUE: Multidetector CT imaging of the abdomen and pelvis was performed following the standard protocol without IV contrast.  COMPARISON:  Oct 10, 2011  FINDINGS: Lower chest: No acute abnormality.  Hepatobiliary: No focal liver abnormality is seen. Status  post cholecystectomy. No biliary dilatation.  Pancreas: Unremarkable. No pancreatic ductal dilatation or surrounding inflammatory changes.  Spleen: Normal in size without focal abnormality.  Adrenals/Urinary Tract: Adrenal glands are unremarkable. Kidneys are normal, without focal lesions. A 6 mm obstructing renal stone is seen within the proximal left ureter, with mild left-sided hydronephrosis and hydroureter. The urinary bladder is contracted and subsequently limited in evaluation.  Stomach/Bowel: Stomach is within normal limits. The appendix is not identified. No evidence of  bowel wall thickening, distention, or inflammatory changes. Noninflamed diverticula are seen throughout the sigmoid colon.  Vascular/Lymphatic: Aortic atherosclerosis. No enlarged abdominal or pelvic lymph nodes.  Reproductive: Prostate is unremarkable.  Other: No abdominal wall hernia or abnormality. No abdominopelvic ascites.  Musculoskeletal: No acute or significant osseous findings.  IMPRESSION: 1.   6 mm obstructing renal stone within the proximal left ureter. 2.   Sigmoid diverticulosis. 3.   Evidence of prior cholecystectomy. 4. Aortic atherosclerosis.  Aortic Atherosclerosis (ICD10-I70.0).   Electronically Signed By: Virgina Norfolk M.D. On: 08/14/2020 23:56   Assessment & Plan:    1. Left proximal ureter calculus We discussed various treatment options for urolithiasis including observation with or without medical expulsive therapy, shockwave lithotripsy (SWL), ureteroscopy and laser lithotripsy with stent placement.  We discussed that management is based on stone size, location, density, patient co-morbidities, and patient preference.   Stones <35m in size have a >80% spontaneous passage rate. Data surrounding the use of tamsulosin for medical expulsive therapy is controversial, but meta analyses suggests it is most efficacious for distal stones between 5-136min size. Possible side effects include dizziness/lightheadedness, and retrograde ejaculation.  SWL has a lower stone free rate in a single procedure, but also a lower complication rate compared to ureteroscopy and avoids a stent and associated stent related symptoms. Possible complications include renal hematoma, steinstrasse, and need for additional treatment.  Ureteroscopy with laser lithotripsy and stent placement has a higher stone free rate than SWL in a single procedure, however increased complication rate including possible infection, ureteral injury, bleeding, and stent related morbidity. Common stent  related symptoms include dysuria, urgency/frequency, and flank pain.  After an extensive discussion of the risks and benefits of the above treatment options, the patient would like to proceed with an initial trial MET since he has previously passed a 10 mm calculus  KUB was ordered today to assess for distal stone progression.  He will be notified with results and further recommendations   I, LyArdyth Galam acting as a scribe for Dr. ScNicki Reaper. Stoioff,  I have reviewed the above documentation for accuracy and completeness, and I agree with the above.   ScAbbie SonsMDUintah279 San Juan LaneSuNew ProvidenceuMeadowbrookNC [id number]

## 2020-08-18 NOTE — Progress Notes (Signed)
08/20/2020 11:13 AM   Corey Livingston 20-Feb-1961 [id number]  Referring provider: Maryland Pink, MD [address] [facility] [place],  [place] [address] Chief Complaint  Patient presents with   Nephrolithiasis    HPI: Corey Livingston is a 60 y.o. male who is seen today for ED follow-up of a recent episode of renal colic  - Patient reported to the ED on 08/15/2020 left flank pain.  - He noted nausea but no vomiting. - No dysuria or hematuria.  - He reported a history of kidney stones.  - He was given Flomax, percocet for pain prn, and zofran.  - CT renal stone study noted a 6 mm obstructing renal stone within the proximal left ureter with mild left hydronephrosis/hydroureter -Since ED visit he has had one episode of recurrent flank pain -Previous history of passing a 10 mm calculus   PMH: Past Medical History:  Diagnosis Date   Kidney stone     Surgical History: Past Surgical History:  Procedure Laterality Date   CHOLECYSTECTOMY     JOINT REPLACEMENT      Home Medications:  Allergies as of 08/20/2020      Reactions   Penicillins       Medication List       Accurate as of August 20, 2020 11:13 AM. If you have any questions, ask your nurse or doctor.        ibuprofen 800 MG tablet Commonly known as: ADVIL Take 1 tablet (800 mg total) by mouth every 8 (eight) hours as needed for mild pain.   meloxicam 15 MG tablet Commonly known as: MOBIC Take 1 tablet (15 mg total) by mouth daily.   ondansetron 4 MG disintegrating tablet Commonly known as: Zofran ODT Take 1 tablet (4 mg total) by mouth every 6 (six) hours as needed for nausea or vomiting.   oxyCODONE-acetaminophen 5-325 MG tablet Commonly known as: Percocet Take 2 tablets by mouth every 6 (six) hours as needed for severe pain.   tamsulosin 0.4 MG Caps capsule Commonly known as: Flomax Take 1 capsule (0.4 mg total) by mouth daily.       Allergies:  Allergies  Allergen Reactions    Penicillins     Family History: History reviewed. No pertinent family history.  Social History:  reports that he has never smoked. He has never used smokeless tobacco. He reports current alcohol use. No history on file for drug use.   Physical Exam: BP (!) 178/95    Pulse 99    Ht 5' 10" (1.778 m)    Wt 245 lb (111.1 kg)    BMI 35.15 kg/m   Constitutional:  Alert and oriented, No acute distress. HEENT: [place] AT, moist mucus membranes.  Trachea midline, no masses. Cardiovascular: No clubbing, cyanosis, or edema. Respiratory: Normal respiratory effort, no increased work of breathing. Skin: No rashes, bruises or suspicious lesions. Neurologic: Grossly intact, no focal deficits, moving all 4 extremities. Psychiatric: Normal mood and affect.  Laboratory Data:  Lab Results  Component Value Date   CREATININE 0.87 08/15/2020    Urinalysis: Pending  Pertinent Imaging: CT images were personally reviewed and interpreted  CT Renal Stone Study  Narrative CLINICAL DATA:  Left flank pain.  EXAM: CT ABDOMEN AND PELVIS WITHOUT CONTRAST  TECHNIQUE: Multidetector CT imaging of the abdomen and pelvis was performed following the standard protocol without IV contrast.  COMPARISON:  Oct 10, 2011  FINDINGS: Lower chest: No acute abnormality.  Hepatobiliary: No focal  liver abnormality is seen. Status post cholecystectomy. No biliary dilatation.  Pancreas: Unremarkable. No pancreatic ductal dilatation or surrounding inflammatory changes.  Spleen: Normal in size without focal abnormality.  Adrenals/Urinary Tract: Adrenal glands are unremarkable. Kidneys are normal, without focal lesions. A 6 mm obstructing renal stone is seen within the proximal left ureter, with mild left-sided hydronephrosis and hydroureter. The urinary bladder is contracted and subsequently limited in evaluation.  Stomach/Bowel: Stomach is within normal limits. The appendix is not identified. No evidence of  bowel wall thickening, distention, or inflammatory changes. Noninflamed diverticula are seen throughout the sigmoid colon.  Vascular/Lymphatic: Aortic atherosclerosis. No enlarged abdominal or pelvic lymph nodes.  Reproductive: Prostate is unremarkable.  Other: No abdominal wall hernia or abnormality. No abdominopelvic ascites.  Musculoskeletal: No acute or significant osseous findings.  IMPRESSION: 1.   6 mm obstructing renal stone within the proximal left ureter. 2.   Sigmoid diverticulosis. 3.   Evidence of prior cholecystectomy. 4. Aortic atherosclerosis.  Aortic Atherosclerosis (ICD10-I70.0).   Electronically Signed By: Virgina Norfolk M.D. On: 08/14/2020 23:56   Assessment & Plan:    1. Left proximal ureter calculus We discussed various treatment options for urolithiasis including observation with or without medical expulsive therapy, shockwave lithotripsy (SWL), ureteroscopy and laser lithotripsy with stent placement.  We discussed that management is based on stone size, location, density, patient co-morbidities, and patient preference.   Stones <67m in size have a >80% spontaneous passage rate. Data surrounding the use of tamsulosin for medical expulsive therapy is controversial, but meta analyses suggests it is most efficacious for distal stones between 5-1100min size. Possible side effects include dizziness/lightheadedness, and retrograde ejaculation.  SWL has a lower stone free rate in a single procedure, but also a lower complication rate compared to ureteroscopy and avoids a stent and associated stent related symptoms. Possible complications include renal hematoma, steinstrasse, and need for additional treatment.  Ureteroscopy with laser lithotripsy and stent placement has a higher stone free rate than SWL in a single procedure, however increased complication rate including possible infection, ureteral injury, bleeding, and stent related morbidity. Common stent  related symptoms include dysuria, urgency/frequency, and flank pain.  After an extensive discussion of the risks and benefits of the above treatment options, the patient would like to proceed with an initial trial MET since he has previously passed a 10 mm calculus  KUB was ordered today to assess for distal stone progression.  He will be notified with results and further recommendations   I, LyArdyth Galam acting as a scribe for Dr. ScNicki Reaper. Stoioff,  I have reviewed the above documentation for accuracy and completeness, and I agree with the above.   ScAbbie SonsMDLaGrange28[address] South Buttonwood StreetSuSawyeruSyossetNC [phone]

## 2020-08-20 ENCOUNTER — Ambulatory Visit (INDEPENDENT_AMBULATORY_CARE_PROVIDER_SITE_OTHER): Payer: Commercial Managed Care - PPO | Admitting: Urology

## 2020-08-20 ENCOUNTER — Ambulatory Visit
Admission: RE | Admit: 2020-08-20 | Discharge: 2020-08-20 | Disposition: A | Discharge status: Home / Self Care | Payer: Commercial Managed Care - PPO | Source: Ambulatory Visit | Attending: Urology | Admitting: Urology

## 2020-08-20 ENCOUNTER — Other Ambulatory Visit: Payer: Self-pay

## 2020-08-20 ENCOUNTER — Encounter: Payer: Self-pay | Admitting: Urology

## 2020-08-20 VITALS — BP 178/95 | HR 99 | Ht 70.0 in | Wt 245.0 lb

## 2020-08-20 DIAGNOSIS — N132 Hydronephrosis with renal and ureteral calculous obstruction: Secondary | ICD-10-CM

## 2020-08-20 DIAGNOSIS — N201 Calculus of ureter: Secondary | ICD-10-CM

## 2020-08-20 DIAGNOSIS — N200 Calculus of kidney: Secondary | ICD-10-CM | POA: Insufficient documentation

## 2020-08-20 DIAGNOSIS — N23 Unspecified renal colic: Secondary | ICD-10-CM

## 2020-08-21 LAB — URINALYSIS, COMPLETE
Bilirubin, UA: NEGATIVE
Glucose, UA: NEGATIVE
Ketones, UA: NEGATIVE
Leukocytes,UA: NEGATIVE
Nitrite, UA: NEGATIVE
Protein,UA: NEGATIVE
Specific Gravity, UA: 1.015 (ref 1.005–1.030)
Urobilinogen, Ur: 1 mg/dL (ref 0.2–1.0)
pH, UA: 7.5 (ref 5.0–7.5)

## 2020-08-21 LAB — MICROSCOPIC EXAMINATION: Bacteria, UA: NONE SEEN

## 2020-08-22 ENCOUNTER — Encounter: Payer: Self-pay | Admitting: Urology

## 2020-08-22 ENCOUNTER — Telehealth: Payer: Self-pay | Admitting: *Deleted

## 2020-08-22 DIAGNOSIS — N201 Calculus of ureter: Secondary | ICD-10-CM

## 2020-08-22 NOTE — Telephone Encounter (Signed)
Notified patient as instructed, patient pleased. Discussed follow-up appointments, patient agrees. Order in

## 2020-08-22 NOTE — Telephone Encounter (Signed)
-----   Message from Riki Altes, MD sent at 08/21/2020  5:10 PM EDT ----- Larina Bras still in same position as CT and has not progressed.  Recommend repeat KUB on Monday 3/28.  Will call with results

## 2020-08-27 ENCOUNTER — Ambulatory Visit
Admission: RE | Admit: 2020-08-27 | Discharge: 2020-08-27 | Disposition: A | Discharge status: Home / Self Care | Payer: Commercial Managed Care - PPO | Source: Ambulatory Visit | Attending: Urology | Admitting: Urology

## 2020-08-27 DIAGNOSIS — N201 Calculus of ureter: Secondary | ICD-10-CM

## 2020-08-29 ENCOUNTER — Telehealth: Payer: Self-pay | Admitting: Urology

## 2020-08-29 NOTE — Telephone Encounter (Signed)
KUB reviewed and stone has not progressed.  Unlikely he will be able to pass.  Does he want to schedule either shockwave lithotripsy or ureteroscopy?

## 2020-08-29 NOTE — Telephone Encounter (Signed)
Notified patient as instructed, Patient states he would like to have the lithotripsy .

## 2020-08-31 NOTE — Telephone Encounter (Signed)
Scheduling sheet completed and given to [facility]

## 2020-09-05 ENCOUNTER — Other Ambulatory Visit
Admission: RE | Admit: 2020-09-05 | Discharge: 2020-09-05 | Disposition: A | Discharge status: Home / Self Care | Payer: Commercial Managed Care - PPO | Source: Ambulatory Visit | Attending: Urology | Admitting: Urology

## 2020-09-05 ENCOUNTER — Other Ambulatory Visit: Payer: Self-pay

## 2020-09-05 ENCOUNTER — Other Ambulatory Visit: Payer: Self-pay | Admitting: Urology

## 2020-09-05 DIAGNOSIS — Z20822 Contact with and (suspected) exposure to covid-19: Secondary | ICD-10-CM | POA: Diagnosis not present

## 2020-09-05 DIAGNOSIS — Z01812 Encounter for preprocedural laboratory examination: Secondary | ICD-10-CM | POA: Insufficient documentation

## 2020-09-05 DIAGNOSIS — N201 Calculus of ureter: Secondary | ICD-10-CM

## 2020-09-05 LAB — SARS CORONAVIRUS 2 (TAT 6-24 HRS): SARS Coronavirus 2: NEGATIVE

## 2020-09-06 ENCOUNTER — Ambulatory Visit: Admission: RE | Admit: 2020-09-06 | Payer: Commercial Managed Care - PPO | Source: Home / Self Care | Admitting: Urology

## 2020-09-06 ENCOUNTER — Encounter: Admission: RE | Payer: Self-pay | Source: Home / Self Care

## 2020-09-06 SURGERY — LITHOTRIPSY, ESWL
Anesthesia: Moderate Sedation | Laterality: Left

## 2020-09-07 ENCOUNTER — Encounter: Payer: Self-pay | Admitting: Urology

## 2020-09-07 ENCOUNTER — Ambulatory Visit: Payer: Commercial Managed Care - PPO | Admitting: Registered Nurse

## 2020-09-07 ENCOUNTER — Encounter
Admission: RE | Disposition: A | Discharge status: Home / Self Care | Payer: Self-pay | Source: Home / Self Care | Attending: Urology

## 2020-09-07 ENCOUNTER — Other Ambulatory Visit: Payer: Self-pay

## 2020-09-07 ENCOUNTER — Ambulatory Visit: Payer: Commercial Managed Care - PPO

## 2020-09-07 ENCOUNTER — Ambulatory Visit
Admission: RE | Admit: 2020-09-07 | Discharge: 2020-09-07 | Disposition: A | Discharge status: Home / Self Care | Payer: Commercial Managed Care - PPO | Attending: Urology | Admitting: Urology

## 2020-09-07 DIAGNOSIS — Z880 Allergy status to penicillin: Secondary | ICD-10-CM | POA: Insufficient documentation

## 2020-09-07 DIAGNOSIS — N201 Calculus of ureter: Secondary | ICD-10-CM

## 2020-09-07 DIAGNOSIS — Z791 Long term (current) use of non-steroidal anti-inflammatories (NSAID): Secondary | ICD-10-CM | POA: Diagnosis not present

## 2020-09-07 DIAGNOSIS — N132 Hydronephrosis with renal and ureteral calculous obstruction: Secondary | ICD-10-CM | POA: Insufficient documentation

## 2020-09-07 HISTORY — DX: Other specified postprocedural states: Z98.890

## 2020-09-07 HISTORY — DX: Other specified postprocedural states: R11.2

## 2020-09-07 HISTORY — PX: CYSTOSCOPY/URETEROSCOPY/HOLMIUM LASER/STENT PLACEMENT: SHX6546

## 2020-09-07 SURGERY — CYSTOSCOPY/URETEROSCOPY/HOLMIUM LASER/STENT PLACEMENT
Anesthesia: General | Laterality: Left

## 2020-09-07 MED ORDER — PHENYLEPHRINE HCL (PRESSORS) 10 MG/ML IV SOLN
INTRAVENOUS | Status: DC | PRN
  Administered 2020-09-07: 100 ug via INTRAVENOUS
  Administered 2020-09-07 (×2): 200 ug via INTRAVENOUS
  Administered 2020-09-07: 100 ug via INTRAVENOUS
  Administered 2020-09-07 (×2): 200 ug via INTRAVENOUS

## 2020-09-07 MED ORDER — FENTANYL CITRATE (PF) 100 MCG/2ML IJ SOLN
INTRAMUSCULAR | Status: DC | PRN
  Administered 2020-09-07: 100 ug via INTRAVENOUS
  Administered 2020-09-07 (×2): 50 ug via INTRAVENOUS

## 2020-09-07 MED ORDER — PROPOFOL 10 MG/ML IV BOLUS
INTRAVENOUS | Status: AC
  Filled 2020-09-07: qty 20

## 2020-09-07 MED ORDER — OXYCODONE HCL 5 MG PO TABS
ORAL_TABLET | ORAL | Status: AC
  Administered 2020-09-07: 5 mg via ORAL
  Filled 2020-09-07: qty 1

## 2020-09-07 MED ORDER — ACETAMINOPHEN 10 MG/ML IV SOLN
INTRAVENOUS | Status: AC
  Filled 2020-09-07: qty 100

## 2020-09-07 MED ORDER — LACTATED RINGERS IV SOLN: INTRAVENOUS | Status: DC

## 2020-09-07 MED ORDER — SCOPOLAMINE 1 MG/3DAYS TD PT72: 1.0000 | MEDICATED_PATCH | TRANSDERMAL | Status: DC

## 2020-09-07 MED ORDER — PROPOFOL 10 MG/ML IV BOLUS
INTRAVENOUS | Status: DC | PRN
  Administered 2020-09-07: 200 mg via INTRAVENOUS

## 2020-09-07 MED ORDER — CEFAZOLIN SODIUM-DEXTROSE 2-4 GM/100ML-% IV SOLN
2.0000 g | INTRAVENOUS | Status: AC
  Administered 2020-09-07: 2 g via INTRAVENOUS

## 2020-09-07 MED ORDER — FENTANYL CITRATE (PF) 100 MCG/2ML IJ SOLN
INTRAMUSCULAR | Status: AC
  Filled 2020-09-07: qty 2

## 2020-09-07 MED ORDER — FAMOTIDINE 20 MG PO TABS
ORAL_TABLET | ORAL | Status: AC
  Administered 2020-09-07: 20 mg via ORAL
  Filled 2020-09-07: qty 1

## 2020-09-07 MED ORDER — ALBUTEROL SULFATE HFA 108 (90 BASE) MCG/ACT IN AERS
INHALATION_SPRAY | RESPIRATORY_TRACT | Status: DC | PRN
  Administered 2020-09-07: 3 via RESPIRATORY_TRACT

## 2020-09-07 MED ORDER — MIDAZOLAM HCL 2 MG/2ML IJ SOLN
INTRAMUSCULAR | Status: AC
  Filled 2020-09-07: qty 2

## 2020-09-07 MED ORDER — LIDOCAINE HCL (PF) 2 % IJ SOLN
INTRAMUSCULAR | Status: AC
  Filled 2020-09-07: qty 5

## 2020-09-07 MED ORDER — ROCURONIUM BROMIDE 100 MG/10ML IV SOLN
INTRAVENOUS | Status: DC | PRN
  Administered 2020-09-07: 20 mg via INTRAVENOUS

## 2020-09-07 MED ORDER — CEFAZOLIN SODIUM-DEXTROSE 2-4 GM/100ML-% IV SOLN
INTRAVENOUS | Status: AC
  Filled 2020-09-07: qty 100

## 2020-09-07 MED ORDER — ONDANSETRON HCL 4 MG/2ML IJ SOLN
INTRAMUSCULAR | Status: DC | PRN
  Administered 2020-09-07: 4 mg via INTRAVENOUS

## 2020-09-07 MED ORDER — OXYCODONE-ACETAMINOPHEN 5-325 MG PO TABS: 2.0000 | ORAL_TABLET | Freq: Four times a day (QID) | ORAL | 0 refills | Status: AC | PRN

## 2020-09-07 MED ORDER — BELLADONNA ALKALOIDS-OPIUM 16.2-60 MG RE SUPP
RECTAL | Status: DC | PRN
  Administered 2020-09-07: 1 via RECTAL

## 2020-09-07 MED ORDER — EPHEDRINE 5 MG/ML INJ
INTRAVENOUS | Status: AC
  Filled 2020-09-07: qty 10

## 2020-09-07 MED ORDER — DEXAMETHASONE SODIUM PHOSPHATE 10 MG/ML IJ SOLN
INTRAMUSCULAR | Status: DC | PRN
  Administered 2020-09-07: 10 mg via INTRAVENOUS

## 2020-09-07 MED ORDER — DEXAMETHASONE SODIUM PHOSPHATE 10 MG/ML IJ SOLN
INTRAMUSCULAR | Status: AC
  Filled 2020-09-07: qty 1

## 2020-09-07 MED ORDER — FENTANYL CITRATE (PF) 100 MCG/2ML IJ SOLN
25.0000 ug | INTRAMUSCULAR | Status: DC | PRN
  Administered 2020-09-07: 50 ug via INTRAVENOUS

## 2020-09-07 MED ORDER — CHLORHEXIDINE GLUCONATE 0.12 % MT SOLN: 15.0000 mL | Freq: Once | OROMUCOSAL | Status: AC

## 2020-09-07 MED ORDER — FENTANYL CITRATE (PF) 100 MCG/2ML IJ SOLN
INTRAMUSCULAR | Status: AC
  Administered 2020-09-07: 25 ug via INTRAVENOUS
  Filled 2020-09-07: qty 2

## 2020-09-07 MED ORDER — TAMSULOSIN HCL 0.4 MG PO CAPS: 0.4000 mg | ORAL_CAPSULE | Freq: Every day | ORAL | 1 refills | Status: DC

## 2020-09-07 MED ORDER — MIDAZOLAM HCL 2 MG/2ML IJ SOLN
INTRAMUSCULAR | Status: DC | PRN
  Administered 2020-09-07 (×2): 1 mg via INTRAVENOUS

## 2020-09-07 MED ORDER — OXYCODONE HCL 5 MG PO TABS: 5.0000 mg | ORAL_TABLET | Freq: Once | ORAL | Status: AC | PRN

## 2020-09-07 MED ORDER — ONDANSETRON HCL 4 MG/2ML IJ SOLN
INTRAMUSCULAR | Status: AC
  Filled 2020-09-07: qty 2

## 2020-09-07 MED ORDER — IOHEXOL 180 MG/ML  SOLN
INTRAMUSCULAR | Status: DC | PRN
  Administered 2020-09-07: 20 mL

## 2020-09-07 MED ORDER — ONDANSETRON HCL 4 MG/2ML IJ SOLN: 4.0000 mg | Freq: Once | INTRAMUSCULAR | Status: DC | PRN

## 2020-09-07 MED ORDER — SUGAMMADEX SODIUM 500 MG/5ML IV SOLN
INTRAVENOUS | Status: DC | PRN
  Administered 2020-09-07: 200 mg via INTRAVENOUS
  Administered 2020-09-07: 100 mg via INTRAVENOUS

## 2020-09-07 MED ORDER — ORAL CARE MOUTH RINSE: 15.0000 mL | Freq: Once | OROMUCOSAL | Status: AC

## 2020-09-07 MED ORDER — KETOROLAC TROMETHAMINE 30 MG/ML IJ SOLN
INTRAMUSCULAR | Status: DC | PRN
  Administered 2020-09-07: 15 mg via INTRAVENOUS

## 2020-09-07 MED ORDER — SULFAMETHOXAZOLE-TRIMETHOPRIM 800-160 MG PO TABS: 1.0000 | ORAL_TABLET | Freq: Every day | ORAL | 0 refills | Status: DC

## 2020-09-07 MED ORDER — KETOROLAC TROMETHAMINE 30 MG/ML IJ SOLN
INTRAMUSCULAR | Status: AC
  Filled 2020-09-07: qty 1

## 2020-09-07 MED ORDER — SUCCINYLCHOLINE CHLORIDE 20 MG/ML IJ SOLN
INTRAMUSCULAR | Status: DC | PRN
  Administered 2020-09-07: 120 mg via INTRAVENOUS

## 2020-09-07 MED ORDER — FAMOTIDINE 20 MG PO TABS: 20.0000 mg | ORAL_TABLET | Freq: Once | ORAL | Status: AC

## 2020-09-07 MED ORDER — OXYCODONE HCL 5 MG/5ML PO SOLN: 5.0000 mg | Freq: Once | ORAL | Status: AC | PRN

## 2020-09-07 MED ORDER — CHLORHEXIDINE GLUCONATE 0.12 % MT SOLN
OROMUCOSAL | Status: AC
  Administered 2020-09-07: 15 mL via OROMUCOSAL
  Filled 2020-09-07: qty 15

## 2020-09-07 MED ORDER — LIDOCAINE HCL (CARDIAC) PF 100 MG/5ML IV SOSY
PREFILLED_SYRINGE | INTRAVENOUS | Status: DC | PRN
  Administered 2020-09-07: 100 mg via INTRAVENOUS

## 2020-09-07 MED ORDER — SCOPOLAMINE 1 MG/3DAYS TD PT72
MEDICATED_PATCH | TRANSDERMAL | Status: AC
  Administered 2020-09-07: 1.5 mg via TRANSDERMAL
  Filled 2020-09-07: qty 1

## 2020-09-07 MED ORDER — ACETAMINOPHEN 10 MG/ML IV SOLN
INTRAVENOUS | Status: DC | PRN
  Administered 2020-09-07: 1000 mg via INTRAVENOUS

## 2020-09-07 MED ORDER — EPHEDRINE SULFATE 50 MG/ML IJ SOLN
INTRAMUSCULAR | Status: DC | PRN
  Administered 2020-09-07 (×2): 5 mg via INTRAVENOUS

## 2020-09-07 SURGICAL SUPPLY — 32 items
BAG DRAIN CYSTO-URO LG1000N (MISCELLANEOUS) ×2 IMPLANT
BRUSH SCRUB EZ 1% IODOPHOR (MISCELLANEOUS) ×2 IMPLANT
CATH URET FLEX-TIP 2 LUMEN 10F (CATHETERS) ×2 IMPLANT
CATH URETL 5X70 OPEN END (CATHETERS) IMPLANT
CNTNR SPEC 2.5X3XGRAD LEK (MISCELLANEOUS)
CONT SPEC 4OZ STER OR WHT (MISCELLANEOUS)
CONT SPEC 4OZ STRL OR WHT (MISCELLANEOUS)
CONTAINER SPEC 2.5X3XGRAD LEK (MISCELLANEOUS) IMPLANT
DRAPE UTILITY 15X26 TOWEL STRL (DRAPES) ×2 IMPLANT
DRSG TEGADERM 2-3/8X2-3/4 SM (GAUZE/BANDAGES/DRESSINGS) ×2 IMPLANT
GLOVE SURG UNDER POLY LF SZ7.5 (GLOVE) ×2 IMPLANT
GOWN STRL REUS W/ TWL LRG LVL3 (GOWN DISPOSABLE) ×1 IMPLANT
GOWN STRL REUS W/ TWL XL LVL3 (GOWN DISPOSABLE) ×1 IMPLANT
GOWN STRL REUS W/TWL LRG LVL3 (GOWN DISPOSABLE) ×2
GOWN STRL REUS W/TWL XL LVL3 (GOWN DISPOSABLE) ×2
GUIDEWIRE GREEN .038 145CM (MISCELLANEOUS) ×4 IMPLANT
GUIDEWIRE STR DUAL SENSOR (WIRE) ×4 IMPLANT
INFUSOR MANOMETER BAG 3000ML (MISCELLANEOUS) ×2 IMPLANT
IV NS IRRIG 3000ML ARTHROMATIC (IV SOLUTION) ×2 IMPLANT
KIT BALLN UROMAX 15FX4 (MISCELLANEOUS) ×1 IMPLANT
KIT BALLN UROMAX 26 75X4 (MISCELLANEOUS) ×1
KIT TURNOVER CYSTO (KITS) ×2 IMPLANT
PACK CYSTO AR (MISCELLANEOUS) ×2 IMPLANT
SET CYSTO W/LG BORE CLAMP LF (SET/KITS/TRAYS/PACK) ×2 IMPLANT
SHEATH URETERAL 12FRX35CM (MISCELLANEOUS) IMPLANT
STENT URET 6FRX24 CONTOUR (STENTS) IMPLANT
STENT URET 6FRX26 CONTOUR (STENTS) IMPLANT
SURGILUBE 2OZ TUBE FLIPTOP (MISCELLANEOUS) ×2 IMPLANT
SYR 10ML LL (SYRINGE) ×2 IMPLANT
TRACTIP FLEXIVA PULSE ID 200 (Laser) IMPLANT
VALVE UROSEAL ADJ ENDO (VALVE) IMPLANT
WATER STERILE IRR 1000ML POUR (IV SOLUTION) ×2 IMPLANT

## 2020-09-07 NOTE — Discharge Instructions (Signed)

## 2020-09-07 NOTE — Anesthesia Postprocedure Evaluation (Signed)
Anesthesia Post Note  Patient: Corey Livingston  Procedure(s) Performed: CYSTOSCOPY/URETEROSCOPY/HOLMIUM LASER/STENT PLACEMENT (Left )  Patient location during evaluation: PACU Anesthesia Type: General Level of consciousness: awake and alert Pain management: pain level controlled Vital Signs Assessment: post-procedure vital signs reviewed and stable Respiratory status: spontaneous breathing, nonlabored ventilation, respiratory function stable and patient connected to nasal cannula oxygen Cardiovascular status: blood pressure returned to baseline and stable Postop Assessment: no apparent nausea or vomiting Anesthetic complications: no   No complications documented.   Last Vitals:  Vitals:   09/07/20 1245 09/07/20 1300  BP: 134/80 125/78  Pulse: 97 84  Resp: 16 18  Temp:    SpO2: 98% 94%    Last Pain:  Vitals:   09/07/20 1300  TempSrc:   PainSc: 3                  Corinda Gubler

## 2020-09-07 NOTE — Anesthesia Procedure Notes (Signed)
Procedure Name: Intubation Performed by: Malva Cogan, CRNA Pre-anesthesia Checklist: Patient identified, Patient being monitored, Timeout performed, Emergency Drugs available and Suction available Patient Re-evaluated:Patient Re-evaluated prior to induction Oxygen Delivery Method: Circle system utilized Preoxygenation: Pre-oxygenation with 100% oxygen Induction Type: IV induction Ventilation: Mask ventilation without difficulty Laryngoscope Size: 3 and McGraph Grade View: Grade II Tube type: Oral Tube size: 7.0 mm Number of attempts: 1 Airway Equipment and Method: Stylet Placement Confirmation: ETT inserted through vocal cords under direct vision,  positive ETCO2 and breath sounds checked- equal and bilateral Secured at: 23 cm Tube secured with: Tape Dental Injury: Teeth and Oropharynx as per pre-operative assessment  Difficulty Due To: Difficult Airway- due to reduced neck mobility, Difficult Airway- due to large tongue and Difficult Airway- due to limited oral opening

## 2020-09-07 NOTE — Interval H&P Note (Signed)
UROLOGY H&P UPDATE  Agree with prior H&P dated 08/20/2020.  60 year old male with left proximal ureteral stone, unable to proceed with shockwave lithotripsy secondary to recent ibuprofen use, and he opted for ureteroscopy and laser lithotripsy today.  Cardiac: RRR Lungs: CTA bilaterally  Laterality: Left Procedure: Left ureteroscopy, laser lithotripsy, stent placement  Urine: Urinalysis with 3-10 RBCs, 6-10 WBCs, no bacteria, nitrite negative  We specifically discussed the risks ureteroscopy including bleeding, infection/sepsis, stent related symptoms including flank pain/urgency/frequency/incontinence/dysuria, ureteral injury, inability to access stone, or need for staged or additional procedures.   Sondra Come, MD 09/07/2020

## 2020-09-07 NOTE — Anesthesia Preprocedure Evaluation (Addendum)
Anesthesia Evaluation  Patient identified by MRN, date of birth, ID band Patient awake    Reviewed: Allergy & Precautions, NPO status , Patient's Chart, lab work & pertinent test results  History of Anesthesia Complications (+) PONV and history of anesthetic complications  Airway Mallampati: III  TM Distance: <3 FB Neck ROM: Full    Dental no notable dental hx. (+) Teeth Intact   Pulmonary neg sleep apnea, neg COPD, Current Smoker and Patient abstained from smoking.,  Signs and symptoms suggestive of sleep apnea   Pulmonary exam normal breath sounds clear to auscultation       Cardiovascular Exercise Tolerance: Good METS(-) hypertension(-) CAD and (-) Past MI negative cardio ROS  (-) dysrhythmias  Rhythm:Regular Rate:Normal - Systolic murmurs    Neuro/Psych negative neurological ROS  negative psych ROS   GI/Hepatic neg GERD  ,(+)     (-) substance abuse  ,   Endo/Other  neg diabetes  Renal/GU negative Renal ROS     Musculoskeletal   Abdominal   Peds  Hematology   Anesthesia Other Findings Past Medical History: No date: Kidney stone No date: PONV (postoperative nausea and vomiting)  Reproductive/Obstetrics                            Anesthesia Physical Anesthesia Plan  ASA: II  Anesthesia Plan: General   Post-op Pain Management:    Induction: Intravenous  PONV Risk Score and Plan: 2 and Ondansetron and Dexamethasone  Airway Management Planned: Oral ETT  Additional Equipment: None  Intra-op Plan:   Post-operative Plan: Extubation in OR  Informed Consent: I have reviewed the patients History and Physical, chart, labs and discussed the procedure including the risks, benefits and alternatives for the proposed anesthesia with the patient or authorized representative who has indicated his/her understanding and acceptance.     Dental advisory given  Plan Discussed with:  CRNA and Surgeon  Anesthesia Plan Comments: (Discussed risks of anesthesia with patient, including PONV, sore throat, lip/dental damage. Rare risks discussed as well, such as cardiorespiratory and neurological sequelae. Patient understands.)        Anesthesia Quick Evaluation

## 2020-09-07 NOTE — Transfer of Care (Signed)
Immediate Anesthesia Transfer of Care Note  Patient: Corey Livingston  Procedure(s) Performed: CYSTOSCOPY/URETEROSCOPY/HOLMIUM LASER/STENT PLACEMENT (Left )  Patient Location: PACU  Anesthesia Type:General  Level of Consciousness: sedated  Airway & Oxygen Therapy: Patient Spontanous Breathing  Post-op Assessment: Report given to RN and Post -op Vital signs reviewed and stable  Post vital signs: Reviewed and stable  Last Vitals:  Vitals Value Taken Time  BP 134/80 09/07/20 1245  Temp    Pulse 85 09/07/20 1248  Resp 11 09/07/20 1248  SpO2 97 % 09/07/20 1248  Vitals shown include unvalidated device data.  Last Pain:  Vitals:   09/07/20 0945  TempSrc: Oral  PainSc: 0-No pain         Complications: No complications documented.

## 2020-09-07 NOTE — Op Note (Signed)
Date of procedure: 09/07/20  Preoperative diagnosis:  1. Left proximal ureteral stone  Postoperative diagnosis:  1. Same  Procedure: 1. Cystoscopy, left retrograde pyelogram with intraoperative interpretation, left ureteroscopy, laser lithotripsy, stent placement  Surgeon: Nickolas Madrid, MD  Anesthesia: General  Complications: None  Intraoperative findings:  1.  Small prostate, normal cystoscopy, ureteral orifices orthotopic bilaterally 2.  Very tight left ureter requiring balloon dilation, stone dusted, uncomplicated stent placement  EBL: Minimal  Specimens: None  Drains: Left 6 French by 26 cm ureteral stent  Indication: Corey Livingston is a 60 y.o. patient with left proximal ureteral stone and poorly controlled pain.  After reviewing the management options for treatment, they elected to proceed with the above surgical procedure(s). We have discussed the potential benefits and risks of the procedure, side effects of the proposed treatment, the likelihood of the patient achieving the goals of the procedure, and any potential problems that might occur during the procedure or recuperation. Informed consent has been obtained.  Description of procedure:  The patient was taken to the operating room and general anesthesia was induced. SCDs were placed for DVT prophylaxis. The patient was placed in the dorsal lithotomy position, prepped and draped in the usual sterile fashion, and preoperative antibiotics(Ancef) were administered. A preoperative time-out was performed.   A 21 French rigid cystoscope was used to intubate the urethra and thorough cystoscopy was performed.  The prostate was small, and bladder mucosa grossly normal.  Ureteral orifices were orthotopic bilaterally.  The stone could be seen in the left proximal ureter on fluoroscopy.  I started by advancing a sensor wire into the left ureteral orifice and this advanced easily up into the kidney.  A dual-lumen ureteral access  catheter was attempted to be passed but met resistance at the distal ureter. I attempted to pass the single channel digital ureteroscope over the wire but met resistance at the distal ureter.  At this point I opted for balloon dilation, and a 15 [place] UroMax dilating balloon was advanced over the wire and the distal ureter was dilated under fluoroscopic vision to 20 ATM.  At this point the dual-lumen access catheter was again passed over the wire and a second safety wire was passed.  The scope advanced easily up to the proximal ureter, but met resistance just a few centimeters below the stone.  Under direct vision, the ureter was subtly narrow.  A Super Stiff wire was advanced through the scope up into the kidney under fluoroscopic vision, and the scope still met resistance.  The safety wire was removed.  I was then able to advance the scope over the Super Stiff wire into the proximal ureter were identified a large yellow calcium oxalate appearing stone.  The 242 m laser fiber on settings of 0.5 J and 40 Hz was used to methodically dust the stone.  The residual stone pushed back up into the upper pole, and was methodically fragmented to <1 mm pieces.  Thorough pyeloscopy revealed no other stones.  Careful pullback ureteroscopy to the mid ureter demonstrated a tight proximal ureter but no abnormalities.  Retrograde pyelogram showed no extravasation or filling defects.  The sensor wire was replaced through the scope and the scope removed.  The rigid cystoscope was backloaded over the wire, and a 6 [place] by 26 cm ureteral stent was uneventfully placed with a curl in the upper pole, as well as in the bladder.  There was brisk drainage of contrast through the side ports of the stent.  The bladder was drained and a belladonna suppository was placed.  Disposition: Stable to PACU  Plan: Stent removal in 2 weeks Bactrim prophylaxis while stent in place  Nickolas Madrid, MD

## 2020-09-08 ENCOUNTER — Encounter: Payer: Self-pay | Admitting: Urology

## 2020-09-12 ENCOUNTER — Other Ambulatory Visit: Payer: Self-pay

## 2020-09-12 ENCOUNTER — Ambulatory Visit (INDEPENDENT_AMBULATORY_CARE_PROVIDER_SITE_OTHER): Payer: Commercial Managed Care - PPO | Admitting: Urology

## 2020-09-12 ENCOUNTER — Encounter: Payer: Self-pay | Admitting: Urology

## 2020-09-12 ENCOUNTER — Telehealth: Payer: Self-pay

## 2020-09-12 VITALS — BP 126/74 | HR 104 | Temp 97.8°F | Ht 70.0 in | Wt 243.5 lb

## 2020-09-12 DIAGNOSIS — R5082 Postprocedural fever: Secondary | ICD-10-CM

## 2020-09-12 DIAGNOSIS — N200 Calculus of kidney: Secondary | ICD-10-CM

## 2020-09-12 DIAGNOSIS — S82143A Displaced bicondylar fracture of unspecified tibia, initial encounter for closed fracture: Secondary | ICD-10-CM | POA: Insufficient documentation

## 2020-09-12 DIAGNOSIS — G8918 Other acute postprocedural pain: Secondary | ICD-10-CM

## 2020-09-12 DIAGNOSIS — M25669 Stiffness of unspecified knee, not elsewhere classified: Secondary | ICD-10-CM | POA: Insufficient documentation

## 2020-09-12 DIAGNOSIS — S8290XA Unspecified fracture of unspecified lower leg, initial encounter for closed fracture: Secondary | ICD-10-CM | POA: Insufficient documentation

## 2020-09-12 DIAGNOSIS — M25569 Pain in unspecified knee: Secondary | ICD-10-CM | POA: Insufficient documentation

## 2020-09-12 MED ORDER — CEFTRIAXONE SODIUM 1 G IJ SOLR
1.0000 g | Freq: Once | INTRAMUSCULAR | Status: AC
  Administered 2020-09-12: 1 g via INTRAMUSCULAR

## 2020-09-12 MED ORDER — CIPROFLOXACIN HCL 500 MG PO TABS: 500.0000 mg | ORAL_TABLET | Freq: Two times a day (BID) | ORAL | 0 refills | Status: DC

## 2020-09-12 MED ORDER — CEFTRIAXONE SODIUM 500 MG IJ SOLR: 500.0000 mg | Freq: Once | INTRAMUSCULAR | Status: DC

## 2020-09-12 NOTE — Progress Notes (Signed)
IM Injection  Patient is present today for an IM Injection  Drug: Rocephin Dose:1GM Location: Right upper outer buttocks Lot: [id number] Exp:09/2022 Patient tolerated well, no complications were noted  Preformed by: Eligha Bridegroom, CMA  Additional notes/ Follow up: patient was kept for for observation due to penicillin allergy, no reaction was noted

## 2020-09-12 NOTE — Progress Notes (Signed)
   09/12/2020 11:57 AM   Corey Livingston 18-Nov-1960 [id number]  Reason for visit: Fever after ureteroscopy  HPI: I saw Corey Livingston as an add-on in urology clinic for fever of 101 after ureteroscopy.  He is a 60 year old male who had a 8 mm left proximal ureteral stone and underwent ureteroscopy on 09/07/2020.  Case was challenging secondary to a very tight left ureter requiring balloon dilation, and a stent was placed with plan to keep for 2 weeks secondary to the narrowness of the left ureter.  He reports fever of 101 degrees last night with some chills, and felt fatigued this morning.  He has a mild cough.  Denies shortness of breath.  He denies any dysuria.  He is having some mild left-sided flank pain.  Urinalysis today is actually relatively benign with 6-10 WBCs, greater than 30 RBCs, no bacteria, nitrite negative, 1+ leukocytes.  Will send for culture.  He has been on Bactrim daily prophylaxis.  Vitals notable for mild tachycardia of 104, afebrile, blood pressure 126/84.  We discussed possible etiologies at length including pyelonephritis, UTI, pneumonia, or COVID-19, as well as less common causes like PE or DVT.  I recommended treating him with ceftriaxone and changing to Cipro twice daily from the daily Bactrim, and send urine for culture.  Return precautions discussed extensively including high fever, worsening malaise, shortness of breath.  Ceftriaxone today, antibiotic changed to Cipro twice daily, follow-up urine culture Keep scheduled follow-up for stent removal in 1 week   Sondra Come, MD  [facility] [address], [address] [place], [place] [address] [phone]

## 2020-09-13 LAB — URINALYSIS, COMPLETE
Bilirubin, UA: NEGATIVE
Glucose, UA: NEGATIVE
Ketones, UA: NEGATIVE
Nitrite, UA: NEGATIVE
Specific Gravity, UA: 1.01 (ref 1.005–1.030)
Urobilinogen, Ur: 1 mg/dL (ref 0.2–1.0)
pH, UA: 6.5 (ref 5.0–7.5)

## 2020-09-13 LAB — MICROSCOPIC EXAMINATION
Bacteria, UA: NONE SEEN
RBC, Urine: >30 /hpf — AB (ref 0–2)

## 2020-09-15 LAB — CULTURE, URINE COMPREHENSIVE

## 2020-09-16 ENCOUNTER — Encounter (INDEPENDENT_AMBULATORY_CARE_PROVIDER_SITE_OTHER): Payer: Self-pay

## 2020-09-17 ENCOUNTER — Other Ambulatory Visit: Payer: Self-pay

## 2020-09-17 DIAGNOSIS — R5082 Postprocedural fever: Secondary | ICD-10-CM

## 2020-09-17 MED ORDER — SULFAMETHOXAZOLE-TRIMETHOPRIM 800-160 MG PO TABS: 1.0000 | ORAL_TABLET | Freq: Two times a day (BID) | ORAL | 0 refills | Status: AC

## 2020-09-17 NOTE — Telephone Encounter (Signed)
See MyChart documentation. Cipro added to allergy list. RX for Bactrim sent per verbal orders from Dr. Richardo Hanks.

## 2020-09-18 NOTE — Telephone Encounter (Signed)
error 

## 2020-09-20 ENCOUNTER — Other Ambulatory Visit: Payer: Self-pay

## 2020-09-20 ENCOUNTER — Ambulatory Visit (INDEPENDENT_AMBULATORY_CARE_PROVIDER_SITE_OTHER): Payer: Commercial Managed Care - PPO | Admitting: Urology

## 2020-09-20 ENCOUNTER — Ambulatory Visit: Payer: Commercial Managed Care - PPO | Admitting: Urology

## 2020-09-20 ENCOUNTER — Encounter: Payer: Self-pay | Admitting: Urology

## 2020-09-20 VITALS — BP 171/79 | HR 83 | Ht 67.0 in | Wt 241.0 lb

## 2020-09-20 DIAGNOSIS — N200 Calculus of kidney: Secondary | ICD-10-CM | POA: Diagnosis not present

## 2020-09-20 DIAGNOSIS — Z466 Encounter for fitting and adjustment of urinary device: Secondary | ICD-10-CM | POA: Diagnosis not present

## 2020-09-20 NOTE — Patient Instructions (Signed)

## 2020-09-20 NOTE — Addendum Note (Signed)
Addended by: Frankey Shown on: 09/20/2020 04:08 PM   Modules accepted: Orders

## 2020-09-20 NOTE — Progress Notes (Signed)
Cystoscopy Procedure Note:  Indication: Stent removal s/p 09/07/2020 left ureteroscopy and laser lithotripsy for left proximal ureteral stone  He had a fever a few days post-op and was given Rocephin and Cipro, urine culture was ultimately negative.  He had an allergic reaction with hives and erythema, and was started on a steroid taper by his PCP.  Unclear if this was temporary bacteremia post procedure or allergic reaction to Cipro or Bactrim.  He has not been on any antibiotics.  He is feeling much better after the steroids.  After informed consent and discussion of the procedure and its risks, Corey Livingston was positioned and prepped in the standard fashion. Cystoscopy was performed with a flexible cystoscope. The stent was grasped with flexible graspers and removed in its entirety. The patient tolerated the procedure well.  Findings: Uncomplicated stent removal  Assessment and Plan: Follow up in 4 weeks with renal ultrasound to evaluate for silent hydronephrosis  We discussed general stone prevention strategies including adequate hydration with goal of producing 2.5 L of urine daily, increasing citric acid intake, increasing calcium intake during high oxalate meals, minimizing animal protein, and decreasing salt intake. Information about dietary recommendations given today.     Sondra Come, MD 09/20/2020

## 2020-10-17 ENCOUNTER — Ambulatory Visit: Payer: Self-pay | Admitting: Urology

## 2022-04-02 ENCOUNTER — Encounter: Payer: Self-pay | Admitting: Urology

## 2022-04-02 ENCOUNTER — Ambulatory Visit
Admission: RE | Admit: 2022-04-02 | Discharge: 2022-04-02 | Disposition: A | Discharge status: Home / Self Care | Payer: BC Managed Care – PPO | Attending: Urology | Admitting: Urology

## 2022-04-02 ENCOUNTER — Ambulatory Visit: Payer: BC Managed Care – PPO | Admitting: Urology

## 2022-04-02 ENCOUNTER — Ambulatory Visit
Admission: RE | Admit: 2022-04-02 | Discharge: 2022-04-02 | Disposition: A | Discharge status: Home / Self Care | Payer: BC Managed Care – PPO | Source: Ambulatory Visit | Attending: Urology | Admitting: Urology

## 2022-04-02 ENCOUNTER — Telehealth: Payer: Self-pay | Admitting: *Deleted

## 2022-04-02 VITALS — BP 147/83 | HR 99 | Ht 67.0 in | Wt 232.0 lb

## 2022-04-02 DIAGNOSIS — N200 Calculus of kidney: Secondary | ICD-10-CM | POA: Diagnosis not present

## 2022-04-02 DIAGNOSIS — R109 Unspecified abdominal pain: Secondary | ICD-10-CM | POA: Diagnosis not present

## 2022-04-02 LAB — MICROSCOPIC EXAMINATION: Bacteria, UA: NONE SEEN

## 2022-04-02 LAB — URINALYSIS, COMPLETE
Bilirubin, UA: NEGATIVE
Ketones, UA: NEGATIVE
Leukocytes,UA: NEGATIVE
Nitrite, UA: NEGATIVE
Protein,UA: NEGATIVE
RBC, UA: NEGATIVE
Specific Gravity, UA: 1.015 (ref 1.005–1.030)
Urobilinogen, Ur: 0.2 mg/dL (ref 0.2–1.0)
pH, UA: 5.5 (ref 5.0–7.5)

## 2022-04-02 MED ORDER — TAMSULOSIN HCL 0.4 MG PO CAPS: 0.4000 mg | ORAL_CAPSULE | Freq: Every day | ORAL | 0 refills | Status: AC

## 2022-04-02 NOTE — Telephone Encounter (Signed)
Spoke with patient and advised results   

## 2022-04-02 NOTE — Progress Notes (Signed)
   04/02/2022 9:24 AM   Corey Livingston [birth year] [id number]  Reason for visit: Follow up nephrolithiasis  HPI: 61 year old male who underwent left ureteroscopy, laser lithotripsy, stent placement on 09/07/2020 for a 6 mm left proximal ureteral stone.  He was originally scheduled for shockwave lithotripsy, but took ibuprofen and was changed to ureteroscopy.  Ureter was very tight at that time requiring balloon dilation, stent was removed 1 week later, but he never followed up for renal ultrasound as recommended to evaluate for silent hydronephrosis.  He reports a few weeks of some intermittent right-sided back and lower abdominal pain.  He this may be worse based on his positioning.  He denies any urinary symptoms or fevers or chills.  I personally reviewed and interpreted his KUB today that shows no definite nephrolithiasis.  Urinalysis today is completely benign.  Possible etiologies would include musculoskeletal or nephrolithiasis.  Nephrolithiasis less likely based on benign KUB and urinalysis today.  He is interested in trial of Flomax for possible stone and passage, and this was prescribed.  Return precautions discussed at length, consider CT if worsening symptoms.    Billey Co, [facility] [address], [place], [place] [address] [phone]

## 2022-04-02 NOTE — Telephone Encounter (Signed)
-----   Message from Billey Co, MD sent at 04/02/2022 12:23 PM EDT ----- Regarding: UA results Urinalysis was completely normal.  If he has persistent pain over the next few weeks or things worsen, call us and we can order CT, but no evidence of kidney stone on x-ray or urine sample  Nickolas Madrid, MD 04/02/2022

## 2022-04-02 NOTE — Patient Instructions (Signed)
Kidney Stones  Kidney stones are solid, rock-like deposits that form inside of the kidneys. The kidneys are a pair of organs that make urine. A kidney stone may form in a kidney and move into other parts of the urinary tract, including the tubes that connect the kidneys to the bladder (ureters), the bladder, and the tube that carries urine out of the body (urethra). As the stone moves through these areas, it can cause intense pain and block the flow of urine. Kidney stones are created when high levels of certain minerals are found in the urine. The stones are usually passed out of the body through urination, but in some cases, medical treatment may be needed to remove them. What are the causes? Kidney stones may be caused by: A condition in which certain glands produce too much parathyroid hormone (primary hyperparathyroidism), which causes too much calcium buildup in the blood. A buildup of uric acid crystals in the bladder (hyperuricosuria). Uric acid is a chemical that the body produces when you eat certain foods. It usually leaves the body in the urine. Narrowing (stricture) of one or both of the ureters. A kidney blockage that is present at birth (congenital obstruction). Past surgery on the kidney or the ureters. What increases the risk? The following factors may make you more likely to develop this condition: Having had a kidney stone in the past. Having a family history of kidney stones. Not drinking enough water. Eating a diet that is high in protein, salt (sodium), or sugar. Being overweight or obese. What are the signs or symptoms? Symptoms of a kidney stone may include: Pain in the side of the abdomen, right below the ribs (flank pain). Pain usually spreads (radiates) to the groin. Needing to urinate often or urgently. Painful urination. Blood in the urine (hematuria). Nausea. Vomiting. Fever and chills. How is this diagnosed? This condition may be diagnosed based on: Your  symptoms and medical history. A physical exam. Blood tests. Urine tests. These may be done before and after the stone passes out of your body through urination. Imaging tests, such as a CT scan, abdominal X-ray, or ultrasound. A procedure to examine the inside of the bladder (cystoscopy). How is this treated? Treatment for kidney stones depends on the size, location, and makeup of the stones. Kidney stones will often pass out of the body through urination. You may need to: Increase your fluid intake to help pass the stone. In some cases, you may be given fluids through an IV and may need to be monitored in the hospital. Take medicine for pain. Make changes in your diet to help prevent kidney stones from coming back. Sometimes, procedures are needed to remove a kidney stone. This may involve: A procedure to break up kidney stones using: A focused beam of light (laser therapy). Shock waves (extracorporeal shock wave lithotripsy). Surgery to remove kidney stones. This may be needed if you have severe pain or have stones that block your urinary tract. Follow these instructions at home: Medicines Take over-the-counter and prescription medicines only as told by your health care provider. Ask your health care provider if the medicine prescribed to you requires you to avoid driving or using heavy machinery. Eating and drinking Drink enough fluid to keep your urine pale yellow. You may be instructed to drink at least 8-10 glasses of water each day. This will help you pass the kidney stone. If directed, change your diet. This may include: Limiting how much sodium you eat. Eating more fruits   and vegetables. Limiting how much animal protein you eat. Animal proteins include red meat, poultry, fish, and eggs. Eating a normal amount of calcium (1,000-1,300 mg per day). Follow instructions from your health care provider about eating or drinking restrictions. General instructions Collect urine samples as  told by your health care provider. You may need to collect a urine sample: 24 hours after you pass the stone. 8-12 weeks after you pass the kidney stone, and every 6-12 months after that. Strain your urine every time you urinate, for as long as directed. Use the strainer that your health care provider recommends. Do not throw out the kidney stone after passing it. Keep the stone so it can be tested by your health care provider. Testing the makeup of your kidney stone may help prevent you from getting kidney stones in the future. Keep all follow-up visits. You may need follow-up X-rays or ultrasounds to make sure that your stone has passed. How is this prevented? To prevent another kidney stone: Drink enough fluid to keep your urine pale yellow. This is the best way to prevent kidney stones. Eat a healthy diet. Follow recommendations from your health care provider about foods to avoid. Recommendations vary depending on the type of kidney stone that you have. You may be instructed to eat a low-protein diet. Maintain a healthy weight. Where to find more information National Kidney Foundation (NKF): www.kidney.org [facility] (UCF): www.urologyhealth.org Contact a health care provider if: You have pain that gets worse or does not get better with medicine. Get help right away if: You have a fever or chills. You develop severe pain. You develop new abdominal pain. You faint. You are unable to urinate. Summary Kidney stones are solid, rock-like deposits that form inside of the kidneys. Kidney stones can cause nausea, vomiting, blood in the urine, abdominal pain, and the urge to urinate often. Treatment for kidney stones depends on the size, location, and makeup of the stones. Kidney stones will often pass out of the body through urination. Kidney stones can be prevented by drinking enough fluids, eating a healthy diet, and maintaining a healthy weight. This information is not intended  to replace advice given to you by your health care provider. Make sure you discuss any questions you have with your health care provider. Document Revised: 08/28/2021 Document Reviewed: 08/28/2021 Elsevier Patient Education  2023 [employer].  

## 2023-01-24 IMAGING — CR DG ABDOMEN 1V
3 series · 3 of 3 positions shown · non-contrast
Comparison: CT 08/14/2020

CLINICAL DATA: Left flank pain.  Follow-up kidney stone.

EXAM:
ABDOMEN - 1 VIEW

[abdomen kub (1 of 3)]
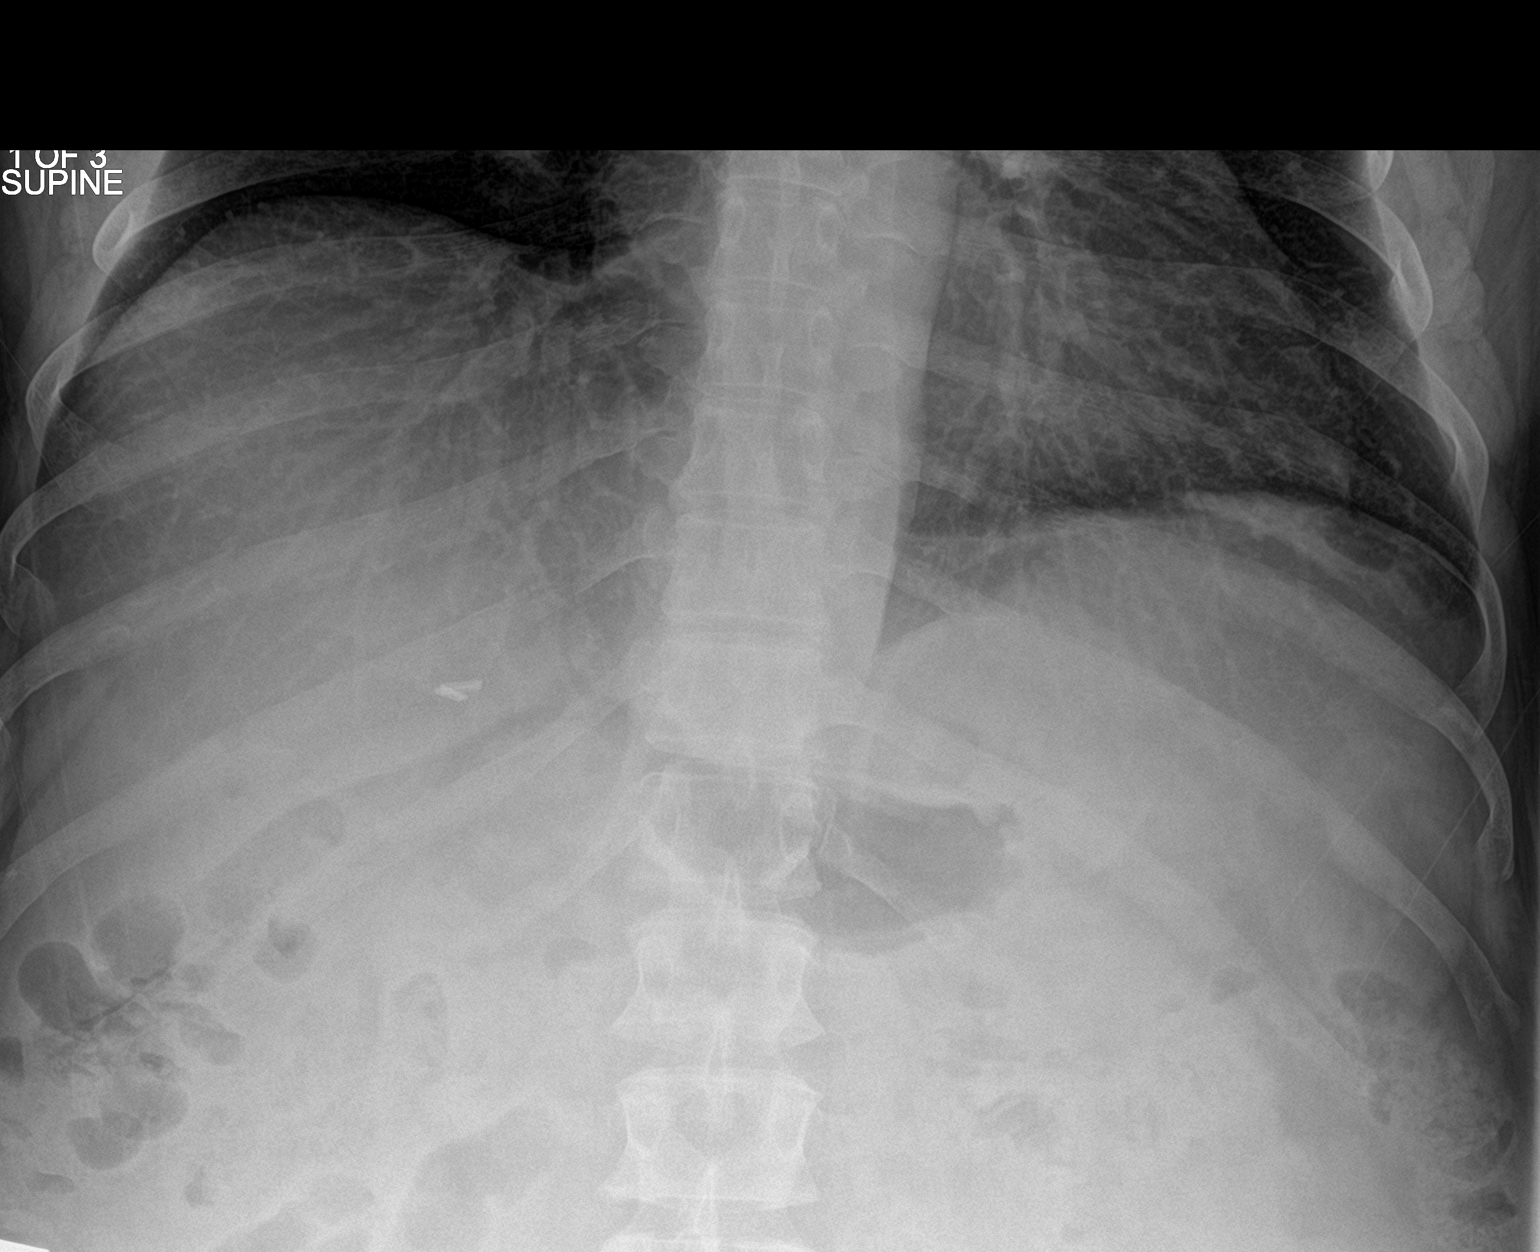

[abdomen kub (2 of 3)]
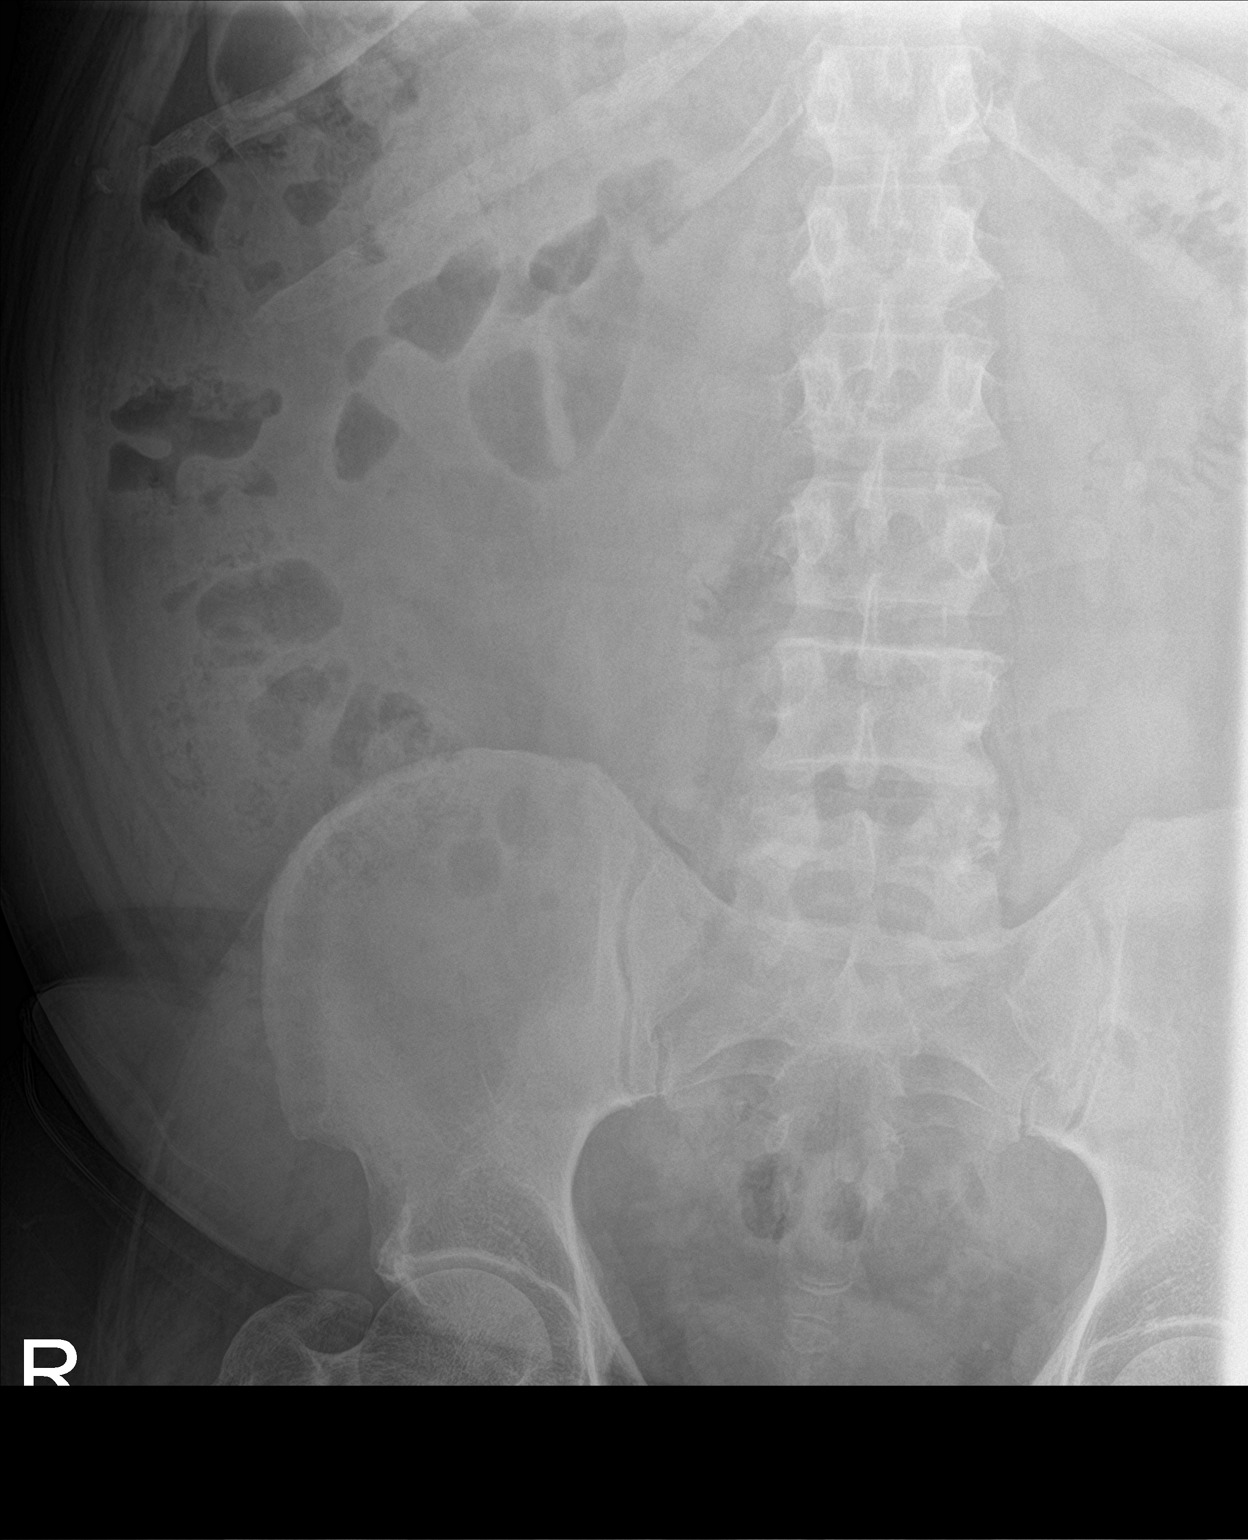

[abdomen kub (3 of 3)]
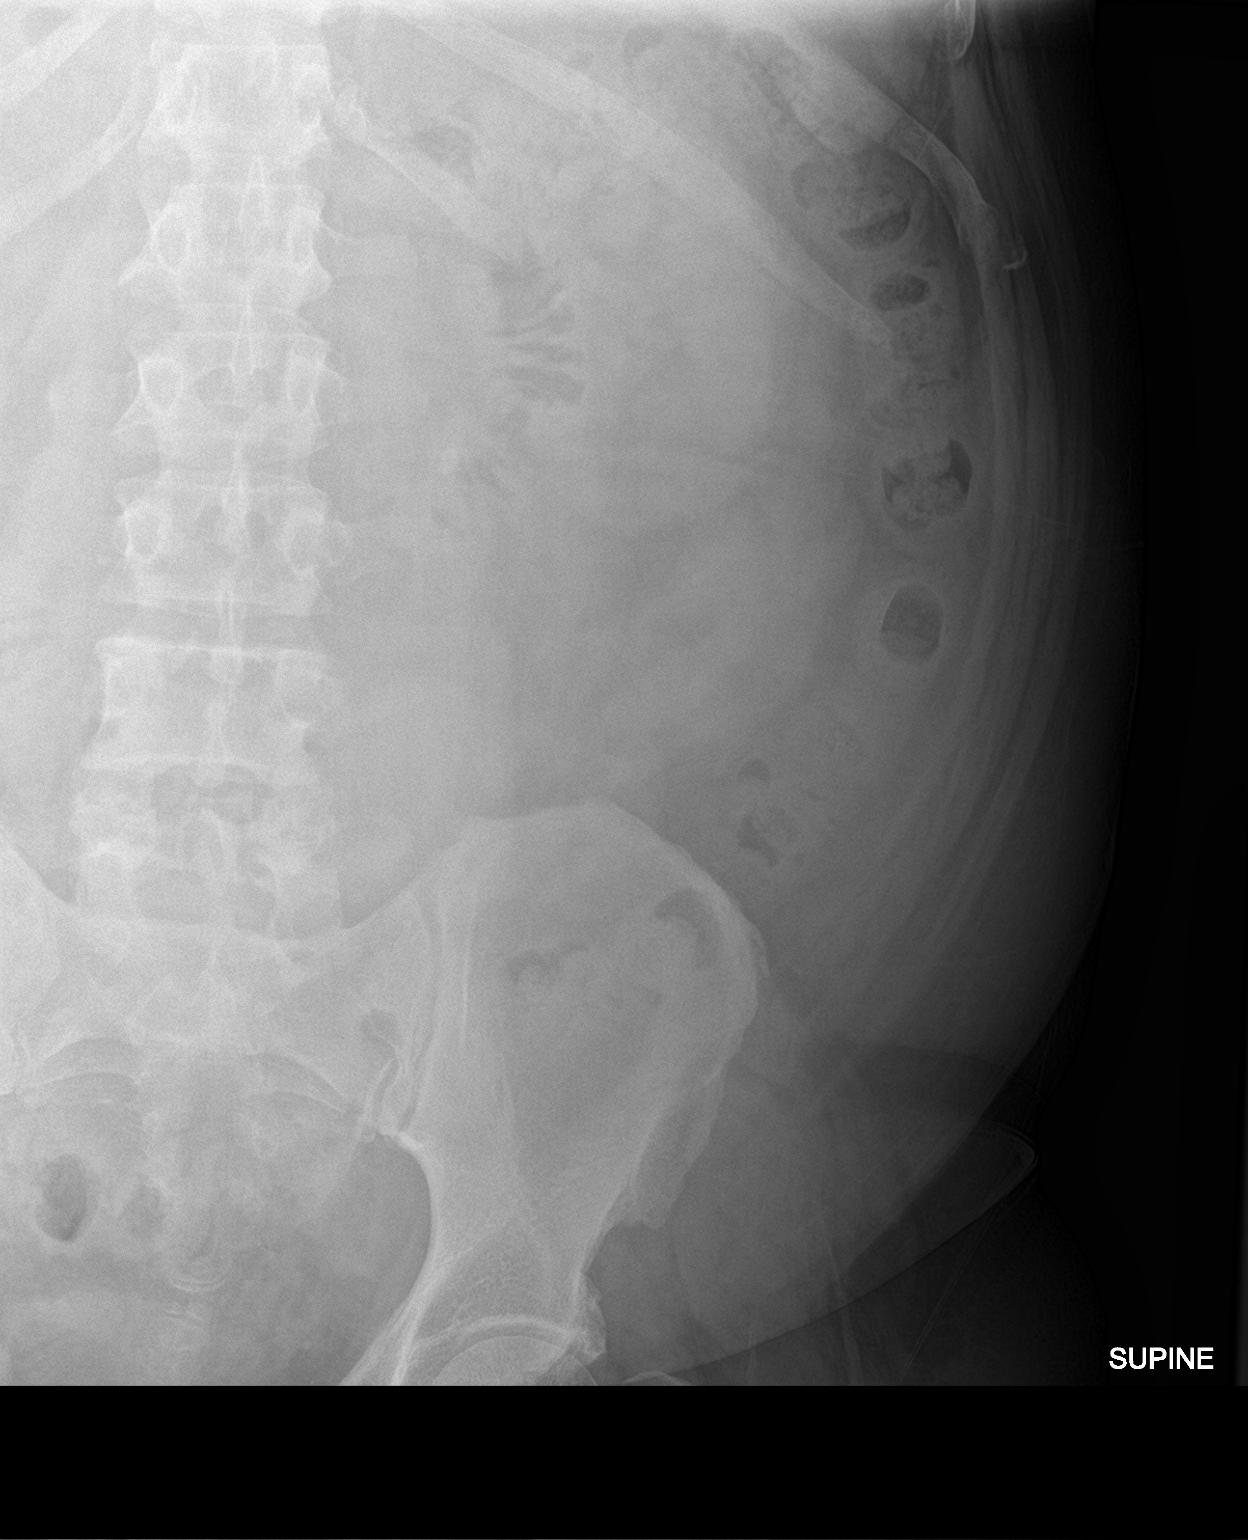

[3 of 3 positions shown; findings below may reference images not displayed]

FINDINGS: Previously seen 6 mm stone has not progressed, remaining within the
proximal ureter at the level of the L2-3 disc space. No other sign
of urinary tract calcification. Bowel gas pattern is normal.
Phleboliths present in the left pelvis.
IMPRESSION: Previously seen 6 mm stone has not progressed, remaining within the
proximal ureter at the level of the L2-3 disc space.

## 2023-01-31 IMAGING — CR DG ABDOMEN 1V
1 series · 2 of 2 positions shown · non-contrast
Comparison: 08/20/2020 and abdomen and pelvis CT dated 08/14/2020.

CLINICAL DATA: Follow-up left ureteral calculus. Continued left
flank pain.

EXAM:
ABDOMEN - 1 VIEW

[Series 1: dg abd 1 view · 0.14mm/px · 2 of 2 slices shown]
[im 1/2]
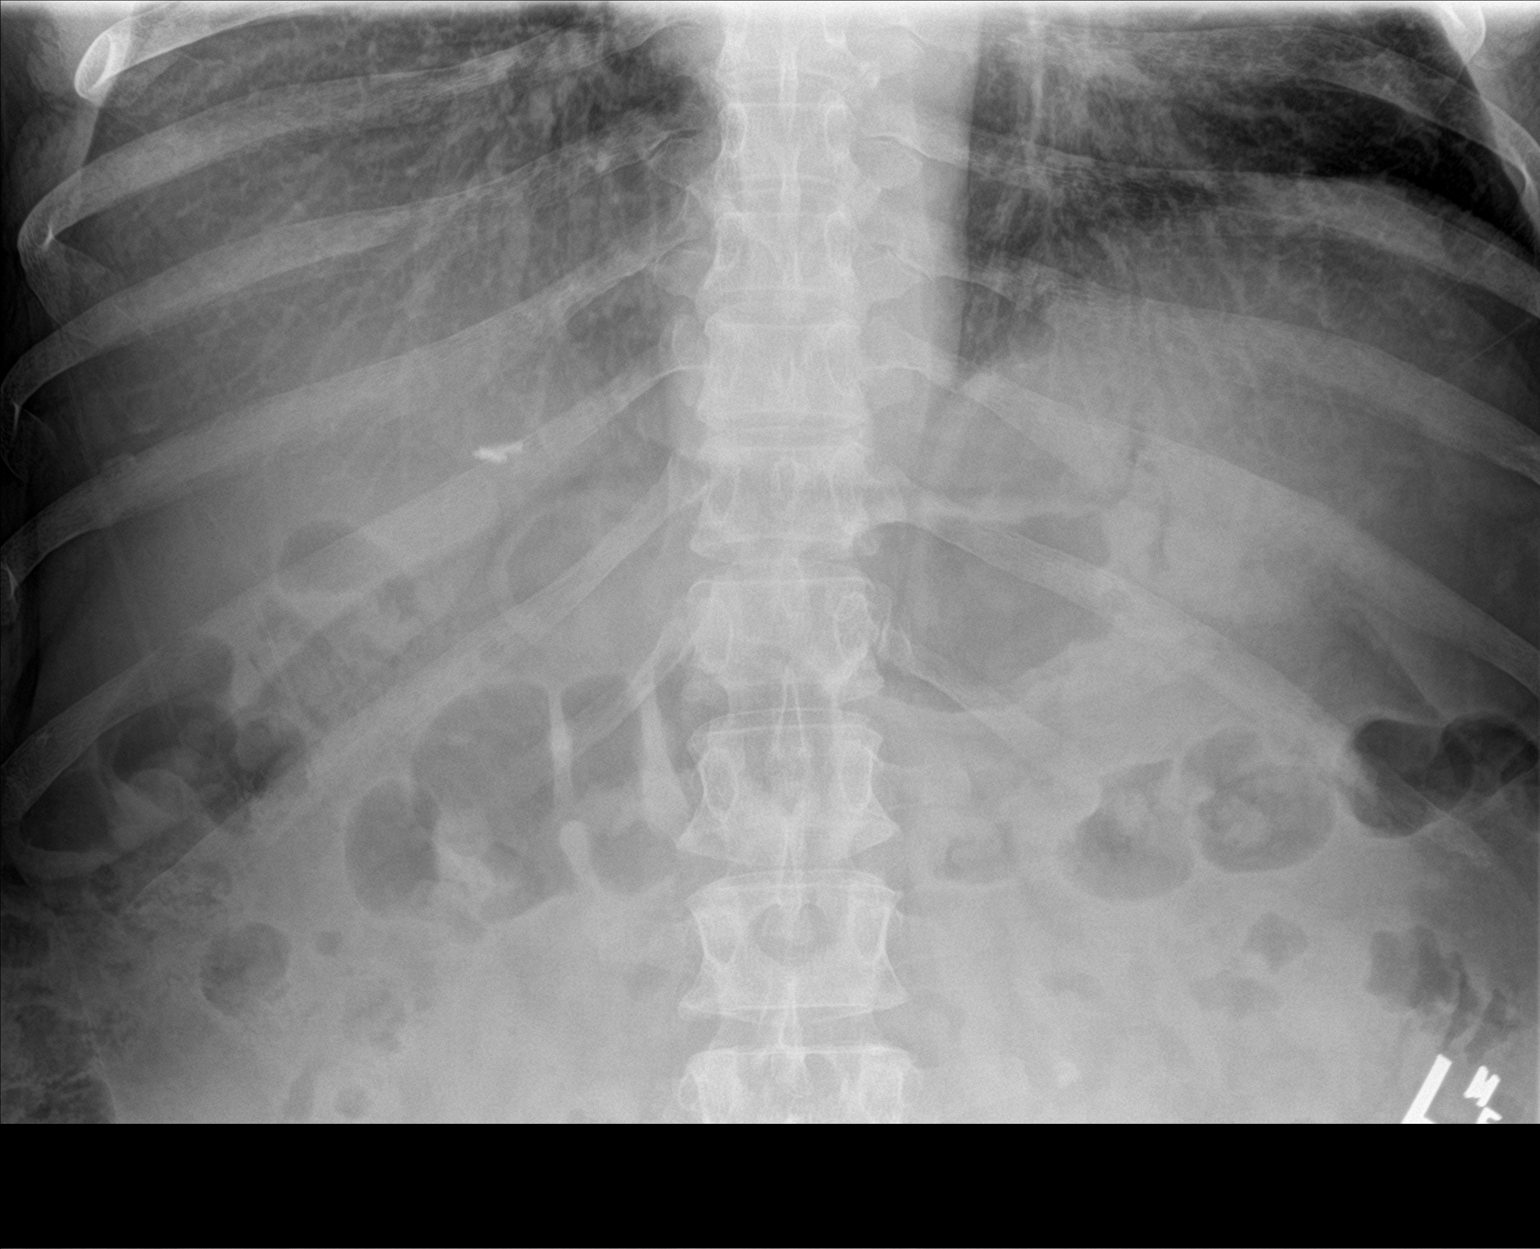
[im 2/2]
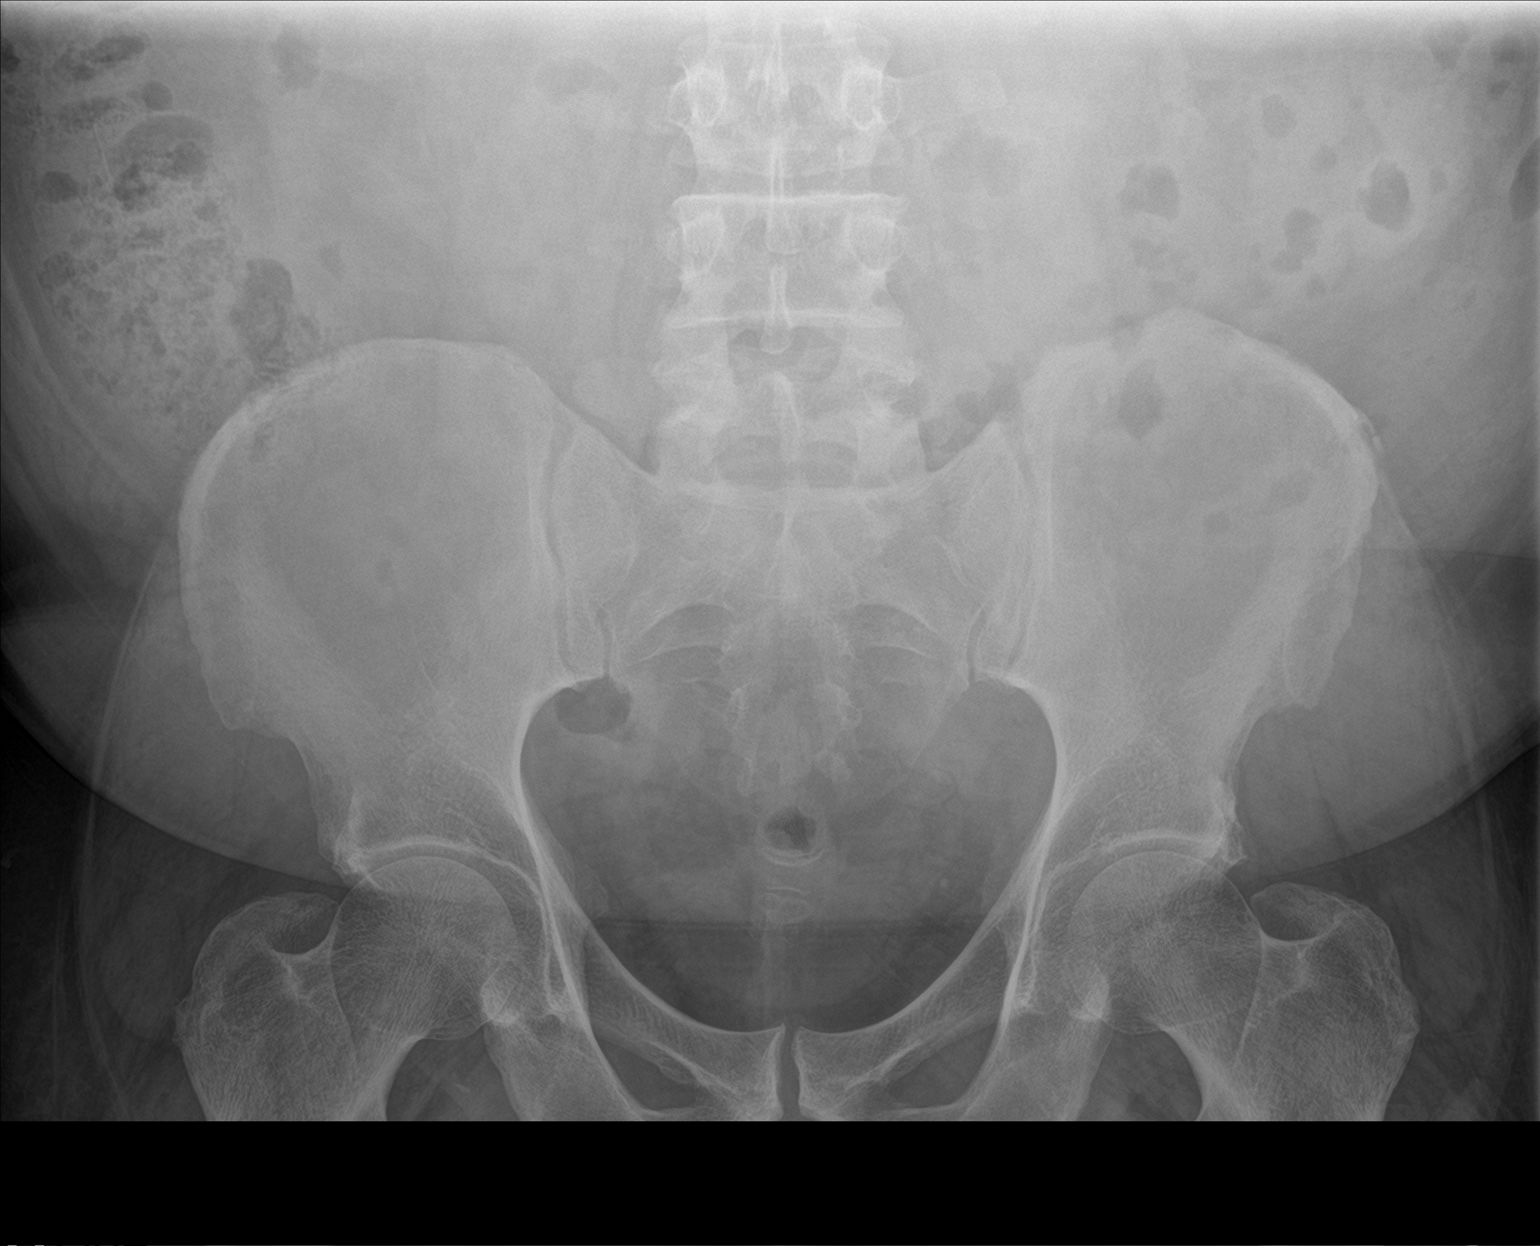

[2 of 2 positions shown; findings below may reference images not displayed]

FINDINGS: The previously demonstrated proximal left ureteral calculus is
unchanged in position. No new calculi are seen. Normal bowel gas
pattern. Cholecystectomy clips. Unremarkable bones.
IMPRESSION: The previously demonstrated proximal left ureteral calculus is
unchanged in position.

## 2024-05-19 ENCOUNTER — Emergency Department

## 2024-05-19 ENCOUNTER — Emergency Department
Admission: EM | Admit: 2024-05-19 | Discharge: 2024-05-19 | Disposition: A | Discharge status: Home / Self Care | Source: Home / Self Care | Attending: Emergency Medicine | Admitting: Emergency Medicine

## 2024-05-19 ENCOUNTER — Telehealth: Payer: Self-pay | Admitting: Urology

## 2024-05-19 ENCOUNTER — Other Ambulatory Visit: Payer: Self-pay

## 2024-05-19 DIAGNOSIS — R10A3 Flank pain, bilateral: Secondary | ICD-10-CM | POA: Insufficient documentation

## 2024-05-19 DIAGNOSIS — R748 Abnormal levels of other serum enzymes: Secondary | ICD-10-CM | POA: Diagnosis not present

## 2024-05-19 DIAGNOSIS — R110 Nausea: Secondary | ICD-10-CM | POA: Insufficient documentation

## 2024-05-19 LAB — URINALYSIS, ROUTINE W REFLEX MICROSCOPIC
Bilirubin Urine: NEGATIVE
Glucose, UA: NEGATIVE mg/dL
Hgb urine dipstick: NEGATIVE
Ketones, ur: NEGATIVE mg/dL
Leukocytes,Ua: NEGATIVE
Nitrite: NEGATIVE
Protein, ur: NEGATIVE mg/dL
Specific Gravity, Urine: 1.025 (ref 1.005–1.030)
pH: 5 (ref 5.0–8.0)

## 2024-05-19 LAB — HEPATIC FUNCTION PANEL
ALT: 16 U/L (ref 0–44)
AST: 23 U/L (ref 15–41)
Albumin: 4.1 g/dL (ref 3.5–5.0)
Alkaline Phosphatase: 72 U/L (ref 38–126)
Bilirubin, Direct: 0.2 mg/dL (ref 0.0–0.2)
Indirect Bilirubin: 0.3 mg/dL (ref 0.3–0.9)
Total Bilirubin: 0.5 mg/dL (ref 0.0–1.2)
Total Protein: 6.9 g/dL (ref 6.5–8.1)

## 2024-05-19 LAB — CBC
HCT: 49.8 % (ref 39.0–52.0)
Hemoglobin: 17.9 g/dL — ABNORMAL HIGH (ref 13.0–17.0)
MCH: 33.2 pg (ref 26.0–34.0)
MCHC: 35.9 g/dL (ref 30.0–36.0)
MCV: 92.4 fL (ref 80.0–100.0)
Platelets: 252 K/uL (ref 150–400)
RBC: 5.39 MIL/uL (ref 4.22–5.81)
RDW: 12.4 % (ref 11.5–15.5)
WBC: 10.9 K/uL — ABNORMAL HIGH (ref 4.0–10.5)
nRBC: 0 % (ref 0.0–0.2)

## 2024-05-19 LAB — BASIC METABOLIC PANEL WITH GFR
Anion gap: 14 (ref 5–15)
BUN: 11 mg/dL (ref 8–23)
CO2: 20 mmol/L — ABNORMAL LOW (ref 22–32)
Calcium: 8.9 mg/dL (ref 8.9–10.3)
Chloride: 101 mmol/L (ref 98–111)
Creatinine, Ser: 0.73 mg/dL (ref 0.61–1.24)
GFR, Estimated: >60 mL/min (ref >60)
Glucose, Bld: 158 mg/dL — ABNORMAL HIGH (ref 70–99)
Potassium: 4.2 mmol/L (ref 3.5–5.1)
Sodium: 135 mmol/L (ref 135–145)

## 2024-05-19 LAB — LIPASE, BLOOD: Lipase: 509 U/L — ABNORMAL HIGH (ref 11–51)

## 2024-05-19 LAB — TRIGLYCERIDES: Triglycerides: 80 mg/dL (ref <150)

## 2024-05-19 MED ORDER — OXYCODONE HCL 5 MG PO TABS: 5.0000 mg | ORAL_TABLET | Freq: Four times a day (QID) | ORAL | 0 refills | Status: AC | PRN

## 2024-05-19 MED ORDER — HYDROMORPHONE HCL 1 MG/ML IJ SOLN: 1.0000 mg | Freq: Once | INTRAMUSCULAR | Status: DC

## 2024-05-19 MED ORDER — LIDOCAINE 5 % EX PTCH
1.0000 | MEDICATED_PATCH | CUTANEOUS | Status: DC
  Administered 2024-05-19: 20:00:00 1 via TRANSDERMAL
  Filled 2024-05-19: qty 1

## 2024-05-19 MED ORDER — ONDANSETRON 4 MG PO TBDP: 4.0000 mg | ORAL_TABLET | Freq: Three times a day (TID) | ORAL | 0 refills | Status: AC | PRN

## 2024-05-19 MED ORDER — LIDOCAINE 5 % EX PTCH: 1.0000 | MEDICATED_PATCH | CUTANEOUS | 0 refills | Status: AC

## 2024-05-19 MED ORDER — SODIUM CHLORIDE 0.9 % IV BOLUS
1000.0000 mL | Freq: Once | INTRAVENOUS | Status: AC
  Administered 2024-05-19: 18:00:00 1000 mL via INTRAVENOUS

## 2024-05-19 MED ORDER — IOHEXOL 300 MG/ML  SOLN: 100.0000 mL | Freq: Once | INTRAMUSCULAR | Status: DC | PRN

## 2024-05-19 MED ORDER — HYDROMORPHONE HCL 1 MG/ML IJ SOLN
0.5000 mg | Freq: Once | INTRAMUSCULAR | Status: AC
  Administered 2024-05-19: 18:00:00 0.5 mg via INTRAVENOUS
  Filled 2024-05-19: qty 0.5

## 2024-05-19 MED ORDER — IOHEXOL 350 MG/ML SOLN
100.0000 mL | Freq: Once | INTRAVENOUS | Status: AC | PRN
  Administered 2024-05-19: 19:00:00 100 mL via INTRAVENOUS

## 2024-05-19 MED ORDER — ACETAMINOPHEN 500 MG PO TABS
1000.0000 mg | ORAL_TABLET | Freq: Once | ORAL | Status: AC
  Administered 2024-05-19: 20:00:00 1000 mg via ORAL
  Filled 2024-05-19: qty 2

## 2024-05-19 MED ORDER — KETOROLAC TROMETHAMINE 15 MG/ML IJ SOLN
15.0000 mg | Freq: Once | INTRAMUSCULAR | Status: AC
  Administered 2024-05-19: 20:00:00 15 mg via INTRAVENOUS
  Filled 2024-05-19: qty 1

## 2024-05-19 MED ORDER — OXYCODONE HCL 5 MG PO TABS
5.0000 mg | ORAL_TABLET | Freq: Once | ORAL | Status: AC
  Administered 2024-05-19: 20:00:00 5 mg via ORAL
  Filled 2024-05-19: qty 1

## 2024-05-19 MED ORDER — ONDANSETRON HCL 4 MG/2ML IJ SOLN
4.0000 mg | Freq: Once | INTRAMUSCULAR | Status: AC
  Administered 2024-05-19: 18:00:00 4 mg via INTRAVENOUS
  Filled 2024-05-19: qty 2

## 2024-05-19 NOTE — ED Triage Notes (Signed)
 Pt here with bilateral flank pain since 1030. Pt endorses nausea. Pt has a hx of kidney stones.

## 2024-05-19 NOTE — Telephone Encounter (Signed)
 Patient dropped in office stating he is having pain and believes he has kidney stones, and wanted to know if he could be seen. (Last office visit was 04/02/22 with Dr. Francisca) I spoke with Triage [facility]), and she advised that he should go to ED. Relayed this to patient and he verbalized understanding.

## 2024-05-19 NOTE — ED Provider Notes (Signed)
 [facility] Provider Note    Event Date/Time   First MD Initiated Contact with Patient 05/19/24 1731     (approximate)   History   Flank Pain   HPI  Corey Livingston is a 63 y.o. male with history of kidney stones who comes in with concerns for bilateral flank pain with some nausea.  Patient reports symptoms started around 1030.  He reports that when he sits still it kind of still comes and goes and like some spasms but is definitely worsened with certain movements.  He denies any numbness, weakness, saddle anesthesia, urinary or rectal incontinence.  He reports having kidney stones per him previously and this feels similar.  He denies any IV drug use.  Patient denies any pain in his upper back his chest or shortness of breath.  Physical Exam   Triage Vital Signs: ED Triage Vitals  Encounter Vitals Group     BP 05/19/24 1618 (!) 148/83     Girls Systolic BP Percentile --      Girls Diastolic BP Percentile --      Boys Systolic BP Percentile --      Boys Diastolic BP Percentile --      Pulse Rate 05/19/24 1618 74     Resp 05/19/24 1618 20     Temp 05/19/24 1618 97.7 F (36.5 C)     Temp Source 05/19/24 1618 Oral     SpO2 05/19/24 1618 96 %     Weight 05/19/24 1618 215 lb (97.5 kg)     Height 05/19/24 1618 5' 7 (1.702 m)     Head Circumference --      Peak Flow --      Pain Score 05/19/24 1628 10     Pain Loc --      Pain Education --      Exclude from Growth Chart --     Most recent vital signs: Vitals:   05/19/24 1618 05/19/24 1819  BP: (!) 148/83   Pulse: 74   Resp: 20   Temp: 97.7 F (36.5 C)   SpO2: 96% 98%     General: Awake, no distress.  CV:  Good peripheral perfusion.  Resp:  Normal effort.  Abd:  No distention.  Soft and nontender Other:  Bilateral flank tenderness no rashes noted.  Patient with worsening pain with trying to sit up.  Equal strength in legs sensation intact.  Good DP pulses bilaterally   ED Results /  Procedures / Treatments   Labs (all labs ordered are listed, but only abnormal results are displayed) Labs Reviewed  URINALYSIS, ROUTINE W REFLEX MICROSCOPIC - Abnormal; Notable for the following components:      Result Value   Color, Urine YELLOW (*)    APPearance CLEAR (*)    All other components within normal limits  BASIC METABOLIC PANEL WITH GFR - Abnormal; Notable for the following components:   CO2 20 (*)    Glucose, Bld 158 (*)    All other components within normal limits  CBC - Abnormal; Notable for the following components:   WBC 10.9 (*)    Hemoglobin 17.9 (*)    All other components within normal limits  LIPASE, BLOOD - Abnormal; Notable for the following components:   Lipase 509 (*)    All other components within normal limits  HEPATIC FUNCTION PANEL  TRIGLYCERIDES    RADIOLOGY I have reviewed the ct personally and interpreted no evidence of obvious kidney stone  PROCEDURES:  Critical Care performed: No  Procedures   MEDICATIONS ORDERED IN ED: Medications  lidocaine  (LIDODERM ) 5 % 1 patch (1 patch Transdermal Patch Applied 05/19/24 1951)  HYDROmorphone  (DILAUDID ) injection 0.5 mg (0.5 mg Intravenous Given 05/19/24 1823)  ondansetron  (ZOFRAN ) injection 4 mg (4 mg Intravenous Given 05/19/24 1823)  sodium chloride  0.9 % bolus 1,000 mL (1,000 mLs Intravenous New Bag/Given 05/19/24 1809)  iohexol  (OMNIPAQUE ) 350 MG/ML injection 100 mL (100 mLs Intravenous Contrast Given 05/19/24 1844)  ketorolac  (TORADOL ) 15 MG/ML injection 15 mg (15 mg Intravenous Given 05/19/24 1945)  acetaminophen  (TYLENOL ) tablet 1,000 mg (1,000 mg Oral Given 05/19/24 1944)  oxyCODONE  (Oxy IR/ROXICODONE ) immediate release tablet 5 mg (5 mg Oral Given 05/19/24 1943)     IMPRESSION / MDM / ASSESSMENT AND PLAN / ED COURSE  I reviewed the triage vital signs and the nursing notes.   Patient's presentation is most consistent with acute presentation with potential threat to life or bodily  function.   Patient comes in with pain in the lower back.  Patient is urine without evidence for UTI.  No evidence of RBCs to suggest kidney stones.  We discussed that sometimes the urine can be limited and you can stop the kidney stone even without RBCs but given patient's age and sudden onset of severe back pain we discussed CT angio to make sure there is no evidence of any aneurysms, ischemic bowel or other acute pathology such as kidney stones.  He was okay with proceeding with CT with contrast.  Patient's CBC was reassuring although slightly elevated white count but patient has no fever.  BMP showed low bicarb with slightly.  Hepatic function was normal lipase was elevated at 509  1. No evidence of mesenteric ischemia. 2. 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  CT imaging was provided for patient so he could follow this up with his primary care doctor.  We discussed his abnormal lipase.  He is got no pain in his left upper quadrant.  We discussed reasons for abnormal lipase level such as alcohol use he reports drinking a week ago but does not drink daily and was only a little bit.  We discussed avoiding any alcohol.  We discussed the possibility of gallstones but he is already has gallbladder removed and LFTs are normal without pain on his upper abdomen so doubt choledocholithiasis.  Added on triglycerides to look for that as a potential cause.  He is not on any medications.  Discussed with patient admission to the hospital versus going home.  Given that his pain does not seem to be consistent with pancreatitis we did discuss treatment for this was fluids, pain medication.  He felt comfortable with discharge home and will follow this up with his primary care doctor.    I reevaluated patient he has no T or L-spine tenderness.  His pain is more flank and  again seems to be worsened by movement so I suspect that there could be a component of musculoskeletal pain.  Considered MRI of the back but he has got no fevers, no midline tenderness no IV drug use no evidence of cord compression.  Did discuss with patient return precautions in regards to this he expressed understanding felt comfortable with this plan.  We discussed Tylenol , ibuprofen , lidocaine  patches and oxycodone  to use only for breakthrough pain and for follow-up outpatient with his primary care doctor or return  here for further evaluation things are changing or worsening.  He expressed understanding and felt comfortable with this plan  The patient is on the cardiac monitor to evaluate for evidence of arrhythmia and/or significant heart rate changes.      FINAL CLINICAL IMPRESSION(S) / ED DIAGNOSES   Final diagnoses:  Bilateral flank pain  Abnormal serum level of lipase     Rx / DC Orders   ED Discharge Orders          Ordered    lidocaine  (LIDODERM ) 5 %  Every 24 hours        05/19/24 1956    oxyCODONE  (ROXICODONE ) 5 MG immediate release tablet  Every 6 hours PRN        05/19/24 1956    ondansetron  (ZOFRAN -ODT) 4 MG disintegrating tablet  Every 8 hours PRN        05/19/24 1959             Note:  This document was prepared using Dragon voice recognition software and may include unintentional dictation errors.   Ernest Ronal BRAVO, MD 05/19/24 2004

## 2024-05-19 NOTE — Discharge Instructions (Addendum)
 Your lipase was elevated but your CT scan did not show any evidence of pancreatitis you really did not have any pain in your left upper abdomen.  Stay well-hydrated with fluids and take the pain medication.  Use Tylenol  1 g every 8 hours and ibuprofen  600 every 8 hours with food over the next 1 week.  If you develop pain in your upper abdomen please return to the ER for repeat evaluation.  Use the oxycodone  for breakthrough pain do not drive or work while on this use MiraLAX to prevent constipation.  Please call your primary care doctor for recheck of your lipase and further workup of this as needed  If you develop any weakness, numbness, difficulties urinating or any other concerns please return to the ER immediately  IMPRESSION: 1. No evidence of mesenteric ischemia. 2. 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  Take oxycodone  as prescribed. Do not drink alcohol, drive or participate in any other potentially dangerous activities while taking this medication as it may make you sleepy. Do not take this medication with any other sedating medications, either prescription or over-the-counter. If you were prescribed Percocet or Vicodin, do not take these with acetaminophen  (Tylenol ) as it is already contained within these medications.  This medication is an opiate (or narcotic) pain medication and can be habit forming. Use it as little as possible to achieve adequate pain control. Do not use or use it with extreme caution if you have a history of opiate abuse or dependence. If you are on a pain contract with your primary care doctor or a pain specialist, be sure to let them know you were prescribed this medication today from the Emergency Department. This medication is intended for your use only - do not give any to anyone else and keep it in a secure place where  nobody else, especially children, have access to it.
